# Patient Record
Sex: Female | Born: 1965 | Race: White | Hispanic: No | Marital: Married | Smoking: Current every day smoker
Health system: Southern US, Community
[De-identification: ages and names within clinical notes are randomized; demographics above are authoritative.]

## PROBLEM LIST (undated history)

## (undated) DIAGNOSIS — R17 Unspecified jaundice: Secondary | ICD-10-CM

## (undated) DIAGNOSIS — E559 Vitamin D deficiency, unspecified: Secondary | ICD-10-CM

## (undated) DIAGNOSIS — M797 Fibromyalgia: Secondary | ICD-10-CM

## (undated) DIAGNOSIS — F32A Depression, unspecified: Secondary | ICD-10-CM

## (undated) DIAGNOSIS — H269 Unspecified cataract: Secondary | ICD-10-CM

## (undated) DIAGNOSIS — E785 Hyperlipidemia, unspecified: Secondary | ICD-10-CM

## (undated) DIAGNOSIS — F329 Major depressive disorder, single episode, unspecified: Secondary | ICD-10-CM

## (undated) DIAGNOSIS — K760 Fatty (change of) liver, not elsewhere classified: Secondary | ICD-10-CM

## (undated) HISTORY — DX: Major depressive disorder, single episode, unspecified: F32.9

## (undated) HISTORY — DX: Hyperlipidemia, unspecified: E78.5

## (undated) HISTORY — DX: Unspecified jaundice: R17

## (undated) HISTORY — PX: CHOLECYSTECTOMY: SHX55

## (undated) HISTORY — DX: Fibromyalgia: M79.7

## (undated) HISTORY — DX: Depression, unspecified: F32.A

## (undated) HISTORY — PX: ABDOMINAL HYSTERECTOMY: SHX81

## (undated) HISTORY — DX: Fatty (change of) liver, not elsewhere classified: K76.0

## (undated) HISTORY — DX: Vitamin D deficiency, unspecified: E55.9

## (undated) HISTORY — DX: Unspecified cataract: H26.9

## (undated) HISTORY — PX: PARTIAL HYSTERECTOMY: SHX80

---

## 2005-04-12 DIAGNOSIS — M797 Fibromyalgia: Secondary | ICD-10-CM

## 2005-04-12 HISTORY — DX: Fibromyalgia: M79.7

## 2005-06-16 ENCOUNTER — Emergency Department: Payer: Self-pay | Admitting: Emergency Medicine

## 2005-12-29 ENCOUNTER — Ambulatory Visit: Payer: Self-pay | Admitting: Internal Medicine

## 2006-03-02 ENCOUNTER — Ambulatory Visit: Payer: Self-pay | Admitting: Gastroenterology

## 2006-03-08 ENCOUNTER — Ambulatory Visit: Payer: Self-pay | Admitting: Gastroenterology

## 2006-04-14 ENCOUNTER — Ambulatory Visit: Payer: Self-pay | Admitting: Gastroenterology

## 2006-04-22 ENCOUNTER — Ambulatory Visit: Payer: Self-pay | Admitting: Gastroenterology

## 2006-10-31 ENCOUNTER — Emergency Department: Payer: Self-pay | Admitting: Emergency Medicine

## 2007-02-24 ENCOUNTER — Other Ambulatory Visit: Payer: Self-pay

## 2007-02-24 ENCOUNTER — Emergency Department: Payer: Self-pay | Admitting: Emergency Medicine

## 2007-06-23 ENCOUNTER — Inpatient Hospital Stay: Payer: Self-pay | Admitting: Surgery

## 2007-06-23 ENCOUNTER — Other Ambulatory Visit: Payer: Self-pay

## 2007-10-04 ENCOUNTER — Ambulatory Visit: Payer: Self-pay | Admitting: Internal Medicine

## 2009-05-04 LAB — HM PAP SMEAR: HM PAP: NORMAL

## 2009-05-04 LAB — HM MAMMOGRAPHY: HM Mammogram: NORMAL

## 2009-09-12 ENCOUNTER — Ambulatory Visit: Payer: Self-pay | Admitting: Unknown Physician Specialty

## 2009-09-17 ENCOUNTER — Ambulatory Visit: Payer: Self-pay | Admitting: Unknown Physician Specialty

## 2009-11-26 ENCOUNTER — Ambulatory Visit: Payer: Self-pay | Admitting: Unknown Physician Specialty

## 2010-02-18 ENCOUNTER — Ambulatory Visit: Payer: Self-pay | Admitting: Internal Medicine

## 2010-03-26 ENCOUNTER — Ambulatory Visit: Payer: Self-pay | Admitting: Internal Medicine

## 2010-04-05 ENCOUNTER — Emergency Department: Payer: Self-pay | Admitting: Internal Medicine

## 2010-04-09 ENCOUNTER — Ambulatory Visit: Payer: Self-pay | Admitting: Podiatry

## 2011-10-08 DIAGNOSIS — M545 Low back pain, unspecified: Secondary | ICD-10-CM | POA: Insufficient documentation

## 2011-10-08 DIAGNOSIS — F332 Major depressive disorder, recurrent severe without psychotic features: Secondary | ICD-10-CM | POA: Insufficient documentation

## 2011-10-08 DIAGNOSIS — M797 Fibromyalgia: Secondary | ICD-10-CM | POA: Insufficient documentation

## 2011-10-08 DIAGNOSIS — G43909 Migraine, unspecified, not intractable, without status migrainosus: Secondary | ICD-10-CM | POA: Insufficient documentation

## 2012-01-10 DIAGNOSIS — F17200 Nicotine dependence, unspecified, uncomplicated: Secondary | ICD-10-CM | POA: Insufficient documentation

## 2012-01-10 DIAGNOSIS — F1190 Opioid use, unspecified, uncomplicated: Secondary | ICD-10-CM | POA: Insufficient documentation

## 2012-01-10 DIAGNOSIS — K219 Gastro-esophageal reflux disease without esophagitis: Secondary | ICD-10-CM | POA: Insufficient documentation

## 2012-05-05 DIAGNOSIS — M7918 Myalgia, other site: Secondary | ICD-10-CM | POA: Insufficient documentation

## 2012-07-05 ENCOUNTER — Ambulatory Visit (INDEPENDENT_AMBULATORY_CARE_PROVIDER_SITE_OTHER): Payer: BC Managed Care – PPO | Admitting: Adult Health

## 2012-07-05 ENCOUNTER — Encounter: Payer: Self-pay | Admitting: Adult Health

## 2012-07-05 VITALS — BP 100/66 | HR 79 | Temp 98.3°F | Ht 67.0 in | Wt 203.0 lb

## 2012-07-05 DIAGNOSIS — N390 Urinary tract infection, site not specified: Secondary | ICD-10-CM

## 2012-07-05 DIAGNOSIS — R300 Dysuria: Secondary | ICD-10-CM | POA: Insufficient documentation

## 2012-07-05 LAB — POCT URINALYSIS DIPSTICK
Glucose, UA: NEGATIVE
Leukocytes, UA: NEGATIVE
Spec Grav, UA: >=1.03

## 2012-07-05 MED ORDER — CIPROFLOXACIN HCL 250 MG PO TABS: 250.0000 mg | ORAL_TABLET | Freq: Two times a day (BID) | ORAL | Status: DC

## 2012-07-05 NOTE — Assessment & Plan Note (Signed)
Suspect patient may have a urinary tract infection although urine dipstick is negative for leukocytes and nitrite. Send urine for culture. Start Cipro x3 days. Return to clinic if symptoms do not resolve.

## 2012-07-05 NOTE — Progress Notes (Signed)
  Subjective:    Patient ID: Brenda Conrad, female    DOB: 1965-10-26, 47 y.o.   MRN: [id number]  HPI  Patient is a 47 year old female followed by Dr. Dan Humphreys at [facility] who presents with dysuria and pressure over bladder area. Patient reports symptoms started ~ 2 weeks ago. She denies fever or chills. She took OTC pyridium and felt relief for a few days but then symptoms returned.    Past Medical History  Diagnosis Date  . Fibromyalgia 2007  . Depression   . HLD (hyperlipidemia)     History   Social History  . Marital Status: Married    Spouse Name: N/A    Number of Children: N/A  . Years of Education: N/A   Occupational History  . Not on file.   Social History Main Topics  . Smoking status: Current Every Day Smoker    Types: Cigarettes  . Smokeless tobacco: Not on file  . Alcohol Use: Yes  . Drug Use: No  . Sexually Active: Not on file   Other Topics Concern  . Not on file   Social History Narrative  . No narrative on file     Review of Systems  Genitourinary: Positive for dysuria, frequency and difficulty urinating. Negative for hematuria and flank pain.       Bladder discomfort   BP 100/66  Pulse 79  Temp(Src) 98.3 F (36.8 C) (Oral)  Ht 5\' 7"  (1.702 m)  Wt 203 lb (92.08 kg)  BMI 31.79 kg/m2  SpO2 98%     Objective:   Physical Exam  Constitutional: She is oriented to person, place, and time. No distress.  Overweight  Cardiovascular: Normal rate, regular rhythm and normal heart sounds.   Pulmonary/Chest: Effort normal and breath sounds normal.  Abdominal: Soft. Bowel sounds are normal. There is no tenderness. There is no rebound and no guarding.  Negative for suprapubic tenderness upon palpation.  Neurological: She is alert and oriented to person, place, and time.  Skin: Skin is warm and dry.  Psychiatric: She has a normal mood and affect. Her behavior is normal. Judgment and thought content normal.       Assessment & Plan:

## 2012-07-05 NOTE — Patient Instructions (Addendum)
  Please start Cipro today. You will take this twice a day for 3 days.  If your symptoms are not improved within 3-4 days, please call the office.

## 2012-07-07 LAB — URINE CULTURE

## 2013-04-26 LAB — HM COLONOSCOPY

## 2013-05-02 ENCOUNTER — Telehealth: Payer: Self-pay | Admitting: Internal Medicine

## 2013-05-02 NOTE — Telephone Encounter (Signed)
We saw her at the other office, but I have not seen her in 3 years. She will need to set up a visit to restablish care.

## 2013-05-02 NOTE — Telephone Encounter (Signed)
Is this your patient?

## 2013-05-02 NOTE — Telephone Encounter (Signed)
The patient is wanting a referral to Dr. Bing Reeavid Marks ,[facility] .She is needing this appointment before 2.2.15. She stated she is a patient of Dr. Tilman NeatWalker's ,but I don't see where she was seen by Dr. Dan HumphreysWalker.

## 2013-05-03 NOTE — Telephone Encounter (Signed)
Called to speak with patient, per patient she has already been scheduled an appointment with Dr. Dan HumphreysWalker for tomorrow.

## 2013-05-04 ENCOUNTER — Encounter (INDEPENDENT_AMBULATORY_CARE_PROVIDER_SITE_OTHER): Payer: Self-pay

## 2013-05-04 ENCOUNTER — Ambulatory Visit (INDEPENDENT_AMBULATORY_CARE_PROVIDER_SITE_OTHER): Payer: Medicare HMO | Admitting: Internal Medicine

## 2013-05-04 ENCOUNTER — Encounter: Payer: Self-pay | Admitting: Internal Medicine

## 2013-05-04 VITALS — BP 102/70 | HR 62 | Temp 98.0°F | Wt 155.0 lb

## 2013-05-04 DIAGNOSIS — F1110 Opioid abuse, uncomplicated: Secondary | ICD-10-CM

## 2013-05-04 DIAGNOSIS — R634 Abnormal weight loss: Secondary | ICD-10-CM

## 2013-05-04 DIAGNOSIS — F17200 Nicotine dependence, unspecified, uncomplicated: Secondary | ICD-10-CM

## 2013-05-04 DIAGNOSIS — F1190 Opioid use, unspecified, uncomplicated: Secondary | ICD-10-CM

## 2013-05-04 DIAGNOSIS — IMO0001 Reserved for inherently not codable concepts without codable children: Secondary | ICD-10-CM

## 2013-05-04 DIAGNOSIS — F329 Major depressive disorder, single episode, unspecified: Secondary | ICD-10-CM

## 2013-05-04 DIAGNOSIS — M797 Fibromyalgia: Secondary | ICD-10-CM

## 2013-05-04 DIAGNOSIS — Z1239 Encounter for other screening for malignant neoplasm of breast: Secondary | ICD-10-CM

## 2013-05-04 DIAGNOSIS — Z1211 Encounter for screening for malignant neoplasm of colon: Secondary | ICD-10-CM

## 2013-05-04 DIAGNOSIS — R17 Unspecified jaundice: Secondary | ICD-10-CM

## 2013-05-04 LAB — COMPREHENSIVE METABOLIC PANEL
ALK PHOS: 107 U/L (ref 39–117)
ALT: 20 U/L (ref 0–35)
AST: 22 U/L (ref 0–37)
Albumin: 3.9 g/dL (ref 3.5–5.2)
BILIRUBIN TOTAL: 1.5 mg/dL — AB (ref 0.3–1.2)
BUN: 10 mg/dL (ref 6–23)
CO2: 31 mEq/L (ref 19–32)
Calcium: 8.9 mg/dL (ref 8.4–10.5)
Chloride: 103 mEq/L (ref 96–112)
Creatinine, Ser: 0.8 mg/dL (ref 0.4–1.2)
GFR: 76.3 mL/min (ref >60.00)
GLUCOSE: 89 mg/dL (ref 70–99)
Potassium: 3.8 mEq/L (ref 3.5–5.1)
SODIUM: 137 meq/L (ref 135–145)
Total Protein: 6.4 g/dL (ref 6.0–8.3)

## 2013-05-04 LAB — CBC WITH DIFFERENTIAL/PLATELET
BASOS PCT: 0.7 % (ref 0.0–3.0)
Basophils Absolute: 0 10*3/uL (ref 0.0–0.1)
Eosinophils Absolute: 0 10*3/uL (ref 0.0–0.7)
Eosinophils Relative: 0.8 % (ref 0.0–5.0)
HCT: 44.1 % (ref 36.0–46.0)
HEMOGLOBIN: 15 g/dL (ref 12.0–15.0)
LYMPHS PCT: 37.5 % (ref 12.0–46.0)
Lymphs Abs: 2.1 10*3/uL (ref 0.7–4.0)
MCHC: 34.1 g/dL (ref 30.0–36.0)
MCV: 92.1 fl (ref 78.0–100.0)
MONOS PCT: 5.6 % (ref 3.0–12.0)
Monocytes Absolute: 0.3 10*3/uL (ref 0.1–1.0)
NEUTROS ABS: 3.1 10*3/uL (ref 1.4–7.7)
Neutrophils Relative %: 55.4 % (ref 43.0–77.0)
Platelets: 132 10*3/uL — ABNORMAL LOW (ref 150.0–400.0)
RBC: 4.79 Mil/uL (ref 3.87–5.11)
RDW: 12.8 % (ref 11.5–14.6)
WBC: 5.6 10*3/uL (ref 4.5–10.5)

## 2013-05-04 LAB — LIPID PANEL
Cholesterol: 170 mg/dL (ref 0–200)
HDL: 53.8 mg/dL (ref >39.00)
LDL CALC: 101 mg/dL — AB (ref 0–99)
TRIGLYCERIDES: 75 mg/dL (ref 0.0–149.0)
Total CHOL/HDL Ratio: 3
VLDL: 15 mg/dL (ref 0.0–40.0)

## 2013-05-04 LAB — TSH: TSH: 1.12 u[IU]/mL (ref 0.35–5.50)

## 2013-05-04 LAB — HEMOGLOBIN A1C: Hgb A1c MFr Bld: 5.2 % (ref 4.6–6.5)

## 2013-05-04 NOTE — Progress Notes (Signed)
Pre-visit discussion using our clinic review tool. No additional management support is needed unless otherwise documented below in the visit note.  

## 2013-05-05 DIAGNOSIS — F329 Major depressive disorder, single episode, unspecified: Secondary | ICD-10-CM | POA: Insufficient documentation

## 2013-05-05 DIAGNOSIS — Z1239 Encounter for other screening for malignant neoplasm of breast: Secondary | ICD-10-CM | POA: Insufficient documentation

## 2013-05-05 DIAGNOSIS — R634 Abnormal weight loss: Secondary | ICD-10-CM | POA: Insufficient documentation

## 2013-05-05 DIAGNOSIS — Z1211 Encounter for screening for malignant neoplasm of colon: Secondary | ICD-10-CM | POA: Insufficient documentation

## 2013-05-05 DIAGNOSIS — M797 Fibromyalgia: Secondary | ICD-10-CM | POA: Insufficient documentation

## 2013-05-05 DIAGNOSIS — R17 Unspecified jaundice: Secondary | ICD-10-CM | POA: Insufficient documentation

## 2013-05-05 NOTE — Assessment & Plan Note (Signed)
Elevated bilirubin noted on labs. Will set up abdominal US for evaluation of liver and pancreas.

## 2013-05-05 NOTE — Assessment & Plan Note (Signed)
Wt Readings from Last 3 Encounters:  05/04/13 155 lb (70.308 kg)  07/05/12 203 lb (92.08 kg)   50lb unintentional weight loss over last year. Labs remarkable only for elevated bilirubin. Recommended abdominal US to evaluate pancreas, liver. Rapid weight loss concerning for malignancy, however pt reports [facility]. Will request records. Encouraged her to consider CT chest to screen for lung cancer, as she is chronic smoker.

## 2013-05-05 NOTE — Assessment & Plan Note (Signed)
Referral placed for screening colonoscopy.

## 2013-05-05 NOTE — Assessment & Plan Note (Signed)
Chronic pain, on disability. [facility]

## 2013-05-05 NOTE — Assessment & Plan Note (Signed)
Symptoms have been controlled with citalopram. Anti-depressant medication [facility], will continue.

## 2013-05-05 NOTE — Assessment & Plan Note (Signed)
Chronic narcotic use for pain management. All narcotics through [facility].

## 2013-05-05 NOTE — Assessment & Plan Note (Signed)
Encouraged smoking cessation. Encouraged CT to screen for lung cancer. Pt will think about this.

## 2013-05-05 NOTE — Progress Notes (Signed)
Subjective:    Patient ID: Brenda Conrad, female    DOB: [birth year], 48 y.o.   MRN: [id number]  HPI 48 year old female with history of fibromyalgia, chronic pain, depression presents for followup. She has been lost to followup for greater than 2 years with the exception of visit for a UTI. She reports that she has been following a regular basis with her pain management physician at [facility]. He continues her on MS Contin and oxycodone. We reviewed his last notes through Epic today. He reports that he is trying to reduce her dose of oxycodone. She reports that pain control has been good on these medications.  Over the last couple of years she has lost 150 pounds. She reports that she has had very poor appetite and was noted to have dysfunction of her gallbladder 2 years ago and had cholecystectomy. However, her appetite has not really improved. She denies any current abdominal pain, nausea, vomiting. She does have intermittent diarrhea and constipation. She has had repeat upper endoscopy which she reports was normal. She reports over the last few months her weight has seemed to stabilize. She is not trying to lose weight. No other focal symptoms such as chest pain, dyspnea, cough.  In regards to depression, symptoms have been well-controlled on citalopram. She has been anxious recently because her son has been diagnosed with "spot on the brain "and she is anxious to get him seen at [facility].  Outpatient Encounter Prescriptions as of 05/04/2013  Medication Sig  . citalopram (CELEXA) 20 MG tablet Take 20 mg by mouth 2 (two) times daily.   . clonazePAM (KLONOPIN) 1 MG tablet   . morphine (MS CONTIN) 30 MG 12 hr tablet Take 30 mg by mouth 2 (two) times daily. 1 tab bid  . Oxycodone HCl 10 MG TABS Take 10 mg by mouth 3 (three) times daily. i 1 tab tifd   BP 102/70  Pulse 62  Temp(Src) 98 F (36.7 C) (Oral)  Wt 155 lb (70.308 kg)  SpO2 97%  Review of Systems  Constitutional: Positive for appetite change  and unexpected weight change. Negative for fever, chills and fatigue.  HENT: Negative for congestion, ear pain, sinus pressure, sore throat, trouble swallowing and voice change.   Eyes: Negative for visual disturbance.  Respiratory: Negative for cough, shortness of breath, wheezing and stridor.   Cardiovascular: Negative for chest pain, palpitations and leg swelling.  Gastrointestinal: Negative for nausea, vomiting, abdominal pain, diarrhea, constipation, blood in stool, abdominal distention and anal bleeding.  Genitourinary: Negative for dysuria and flank pain.  Musculoskeletal: Positive for arthralgias, back pain, myalgias and neck pain. Negative for gait problem.  Skin: Negative for color change and rash.  Neurological: Negative for dizziness and headaches.  Hematological: Negative for adenopathy. Does not bruise/bleed easily.  Psychiatric/Behavioral: Negative for suicidal ideas, sleep disturbance and dysphoric mood. The patient is not nervous/anxious.        Objective:   Physical Exam  Constitutional: She is oriented to person, place, and time. She appears well-developed and well-nourished. No distress.  HENT:  Head: Normocephalic and atraumatic.  Right Ear: External ear normal.  Left Ear: External ear normal.  Nose: Nose normal.  Mouth/Throat: Oropharynx is clear and moist. No oropharyngeal exudate.  Eyes: Conjunctivae are normal. Pupils are equal, round, and reactive to light. Right eye exhibits no discharge. Left eye exhibits no discharge. No scleral icterus.  Neck: Normal range of motion. Neck supple. No tracheal deviation present. No thyromegaly present.  Cardiovascular: Normal rate,  regular rhythm, normal heart sounds and intact distal pulses.  Exam reveals no gallop and no friction rub.   No murmur heard. Pulmonary/Chest: Effort normal and breath sounds normal. No accessory muscle usage. Not tachypneic. No respiratory distress. She has no decreased breath sounds. She has no  wheezes. She has no rhonchi. She has no rales. She exhibits no tenderness.  Abdominal: Soft. Bowel sounds are normal. She exhibits no distension and no mass. There is no tenderness. There is no rebound and no guarding.  Musculoskeletal: Normal range of motion. She exhibits no edema and no tenderness.  Lymphadenopathy:    She has no cervical adenopathy.  Neurological: She is alert and oriented to person, place, and time. No cranial nerve deficit. She exhibits normal muscle tone. Coordination normal.  Skin: Skin is warm and dry. No rash noted. She is not diaphoretic. No erythema. No pallor.  Psychiatric: She has a normal mood and affect. Her behavior is normal. Judgment and thought content normal.          Assessment & Plan:  Over of which >50% spent in face-to-face contact with patient discussing plan of care

## 2013-05-05 NOTE — Assessment & Plan Note (Signed)
Mammogram ordered

## 2013-05-07 ENCOUNTER — Telehealth: Payer: Self-pay | Admitting: Internal Medicine

## 2013-05-07 NOTE — Telephone Encounter (Signed)
Relevant patient education assigned to patient using Emmi. ° °

## 2013-05-09 ENCOUNTER — Encounter: Payer: Self-pay | Admitting: Emergency Medicine

## 2013-05-11 ENCOUNTER — Ambulatory Visit: Payer: Self-pay | Admitting: Internal Medicine

## 2013-05-14 ENCOUNTER — Telehealth: Payer: Self-pay | Admitting: Internal Medicine

## 2013-05-14 NOTE — Telephone Encounter (Signed)
The patient is calling for her Ultrasound results from Friday 1.30.15. I did inform her that Dr. Dan HumphreysWalker is out of the office and she would return on Thursday. She stated she was needing to know her results before then.

## 2013-05-16 NOTE — Telephone Encounter (Signed)
Results are in Dr. Tilman NeatWalker's folder for review upon her return.

## 2013-05-16 NOTE — Telephone Encounter (Signed)
Patient informed and verbalized understanding

## 2013-05-16 NOTE — Telephone Encounter (Signed)
Ultrasound of the abdomen was normal.

## 2013-06-11 ENCOUNTER — Encounter: Payer: Self-pay | Admitting: Emergency Medicine

## 2013-06-18 ENCOUNTER — Telehealth: Payer: Self-pay | Admitting: Internal Medicine

## 2013-06-18 NOTE — Telephone Encounter (Signed)
The patient is wanting to come in to leave a specimen to check for a UTI . Please advise . No available appointments.

## 2013-06-18 NOTE — Telephone Encounter (Signed)
Spoke with patient, she is having some painful urination and back pain. Explained to patient she would have to be seen prior to any antibiotics being called in. Patient confirmed appointment for tomorrow with NP, Raquel.

## 2013-06-19 ENCOUNTER — Ambulatory Visit: Payer: Medicare HMO | Admitting: Adult Health

## 2013-07-10 ENCOUNTER — Encounter: Payer: Self-pay | Admitting: Internal Medicine

## 2013-08-03 ENCOUNTER — Encounter: Payer: Medicare HMO | Admitting: Internal Medicine

## 2013-08-30 ENCOUNTER — Ambulatory Visit (INDEPENDENT_AMBULATORY_CARE_PROVIDER_SITE_OTHER): Payer: Commercial Managed Care - HMO | Admitting: Internal Medicine

## 2013-08-30 ENCOUNTER — Encounter: Payer: Self-pay | Admitting: Internal Medicine

## 2013-08-30 VITALS — BP 90/68 | HR 80 | Ht 66.0 in | Wt 179.4 lb

## 2013-08-30 DIAGNOSIS — R1084 Generalized abdominal pain: Secondary | ICD-10-CM

## 2013-08-30 DIAGNOSIS — K5900 Constipation, unspecified: Secondary | ICD-10-CM

## 2013-08-30 DIAGNOSIS — Z1211 Encounter for screening for malignant neoplasm of colon: Secondary | ICD-10-CM

## 2013-08-30 MED ORDER — LINACLOTIDE 145 MCG PO CAPS: 145.0000 ug | ORAL_CAPSULE | Freq: Every day | ORAL | Status: DC

## 2013-08-30 MED ORDER — MOVIPREP 100 G PO SOLR: 1.0000 | Freq: Once | ORAL | Status: DC

## 2013-08-30 NOTE — Patient Instructions (Addendum)
You have been given a separate informational sheet regarding your tobacco use, the importance of quitting and local resources to help you quit.  You have been given some samples of Linzess.  Take one capsule 30 minutes before breakfast.  You have been scheduled for a colonoscopy with propofol. Please follow written instructions given to you at your visit today.  Please pick up your prep kit at the pharmacy within the next 1-3 days. If you use inhalers (even only as needed), please bring them with you on the day of your procedure. Your physician has requested that you go to www.startemmi.com and enter the access code given to you at your visit today. This web site gives a general overview about your procedure. However, you should still follow specific instructions given to you by our office regarding your preparation for the procedure.

## 2013-08-30 NOTE — Progress Notes (Signed)
HISTORY OF PRESENT ILLNESS:  Brenda ClinesLesia Conrad is a 48 y.o. female on disability and chronic narcotics for fibromyalgia who is sent today regarding constipation and screening colonoscopy. The patient reports chronic constipation for several years. Worse recently. Has taken MiraLax in the past without improvement. Dulcolax causes abdominal cramping. More recently placed on lactulose with mixed results. She also reports a she's with weight loss related to loss of appetite. Reports 100 pound weight loss. However, her weight in March of 2014 was 203. In January of 2015 was 155. Currently 179 pounds,  as she acknowledges 20-25 pound weight gain since January with improved appetite. Does have some abdominal discomfort related to constipation. Abdominal ultrasound in January was unremarkable postcholecystectomy. Extensive workup at [facility] for elevated liver tests including liver biopsy. Diagnosed to fatty liver. Most recent liver tests essentially normal with bilirubin 1.5 as the only slight abnormality. She has not had screening colonoscopy. Now age 48. No family history of colon cancer. Review of laboratories from January unremarkable except for platelets of 132,000. [facility]. In addition to prior cholecystectomy, she has had a number of EGDs elsewhere which she reports as being normal  REVIEW OF SYSTEMS:  All non-GI ROS negative except for anxiety, back pain, confusion, depression, fatigue, headaches, muscle pains, muscle cramps, insomnia  Past Medical History  Diagnosis Date  . Fibromyalgia 2007  . Depression   . HLD (hyperlipidemia)   . Elevated bilirubin   . Fatty liver   . Vitamin D deficiency   . Cataract     Past Surgical History  Procedure Laterality Date  . Cholecystectomy    . Vaginal delivery      1  . Partial hysterectomy      Social History Brenda ClinesLesia Conrad  reports that she has been smoking Cigarettes.  She has been smoking about 0.00 packs per day. She has  never used smokeless tobacco. She reports that she drinks alcohol. She reports that she does not use illicit drugs.  family history includes Breast cancer in her sister; Heart disease in her brother, father, and mother.  Allergies  Allergen Reactions  . Vicodin [Hydrocodone-Acetaminophen]        PHYSICAL EXAMINATION: Vital signs: BP 90/68  Pulse 80  Ht 5\' 6"  (1.676 m)  Wt 179 lb 6 oz (81.364 kg)  BMI 28.97 kg/m2  Constitutional: generally well-appearing, no acute distress Psychiatric: alert and oriented x3, cooperative Eyes: extraocular movements intact, anicteric, conjunctiva pink Mouth: oral pharynx moist, no lesions Neck: supple no lymphadenopathy Cardiovascular: heart regular rate and rhythm, no murmur Lungs: clear to auscultation bilaterally Abdomen: soft, obese, nontender, nondistended, no obvious ascites, no peritoneal signs, normal bowel sounds, no organomegaly Rectal: Deferred until colonoscopy Extremities: no lower extremity edema bilaterally Skin: no lesions on visible extremities Neuro: No focal deficits. No asterixis.    ASSESSMENT:  #1. Chronic constipation with associated abdominal discomfort. Functional and exacerbated by medications including narcotics #2. Screening colonoscopy. Baseline risk. Appropriate candidate without contraindication #3. History of transient weight loss. Weight gain recently. Still above ideal body weight #4. Chronic pain syndrome. On chronic narcotics #5. History of elevated LFTs (normal female) [facility] including liver biopsy with minimal findings aside from fatty liver   PLAN:  #1. Continue lactulose #2. Linzess 145 mcg daily for one week. If helpful continue. If not helpful increase dose to 290 mcg daily. Multiple samples given #3. Colonoscopy.The nature of the procedure, as well as the risks, benefits, and alternatives  were carefully and thoroughly reviewed with the patient. Ample time for discussion and questions  allowed. The patient understood, was satisfied, and agreed to proceed. Given chronic constipation will add one bottle of magnesium site trade the morning prior to the exam and prior to standard Movi prep.

## 2013-09-10 ENCOUNTER — Telehealth: Payer: Self-pay | Admitting: Internal Medicine

## 2013-09-11 NOTE — Telephone Encounter (Signed)
Called [facility] and was told that I could fax a copy of the free Moviprep voucher as patient could not come to the office to pick it up.  Faxed the voucher and called patient to let her know.  Patient acknowledged and understood

## 2013-09-13 ENCOUNTER — Ambulatory Visit (AMBULATORY_SURGERY_CENTER): Payer: Medicare HMO | Admitting: Internal Medicine

## 2013-09-13 ENCOUNTER — Encounter: Payer: Self-pay | Admitting: Internal Medicine

## 2013-09-13 VITALS — BP 102/62 | HR 66 | Temp 97.7°F | Resp 10 | Ht 66.0 in | Wt 179.0 lb

## 2013-09-13 DIAGNOSIS — Z1211 Encounter for screening for malignant neoplasm of colon: Secondary | ICD-10-CM

## 2013-09-13 MED ORDER — SODIUM CHLORIDE 0.9 % IV SOLN: 500.0000 mL | INTRAVENOUS | Status: DC

## 2013-09-13 NOTE — Progress Notes (Signed)
Procedure ends, to recovery, report given and VSS. 

## 2013-09-13 NOTE — Patient Instructions (Signed)
Discharge instructions given with verbal understanding. Handouts on diverticulosis and a high fiber diet. Resume previous medications. YOU HAD AN ENDOSCOPIC PROCEDURE TODAY AT THE [facility]: Refer to the procedure report that was given to you for any specific questions about what was found during the examination.  If the procedure report does not answer your questions, please call your gastroenterologist to clarify.  If you requested that your care partner not be given the details of your procedure findings, then the procedure report has been included in a sealed envelope for you to review at your convenience later.  YOU SHOULD EXPECT: Some feelings of bloating in the abdomen. Passage of more gas than usual.  Walking can help get rid of the air that was put into your GI tract during the procedure and reduce the bloating. If you had a lower endoscopy (such as a colonoscopy or flexible sigmoidoscopy) you may notice spotting of blood in your stool or on the toilet paper. If you underwent a bowel prep for your procedure, then you may not have a normal bowel movement for a few days.  DIET: Your first meal following the procedure should be a light meal and then it is ok to progress to your normal diet.  A half-sandwich or bowl of soup is an example of a good first meal.  Heavy or fried foods are harder to digest and may make you feel nauseous or bloated.  Likewise meals heavy in dairy and vegetables can cause extra gas to form and this can also increase the bloating.  Drink plenty of fluids but you should avoid alcoholic beverages for 24 hours.  ACTIVITY: Your care partner should take you home directly after the procedure.  You should plan to take it easy, moving slowly for the rest of the day.  You can resume normal activity the day after the procedure however you should NOT DRIVE or use heavy machinery for 24 hours (because of the sedation medicines used during the test).    SYMPTOMS TO REPORT  IMMEDIATELY: A gastroenterologist can be reached at any hour.  During normal business hours, 8:30 AM to 5:00 PM Monday through Friday, call [phone].  After hours and on weekends, please call the GI answering service at [phone] who will take a message and have the physician on call contact you.   Following lower endoscopy (colonoscopy or flexible sigmoidoscopy):  Excessive amounts of blood in the stool  Significant tenderness or worsening of abdominal pains  Swelling of the abdomen that is new, acute  Fever of 100F or higher FOLLOW UP: If any biopsies were taken you will be contacted by phone or by letter within the next 1-3 weeks.  Call your gastroenterologist if you have not heard about the biopsies in 3 weeks.  Our staff will call the home number listed on your records the next business day following your procedure to check on you and address any questions or concerns that you may have at that time regarding the information given to you following your procedure. This is a courtesy call and so if there is no answer at the home number and we have not heard from you through the emergency physician on call, we will assume that you have returned to your regular daily activities without incident.  SIGNATURES/CONFIDENTIALITY: You and/or your care partner have signed paperwork which will be entered into your electronic medical record.  These signatures attest to the fact that that the information above on your After   Visit Summary has been reviewed and is understood.  Full responsibility of the confidentiality of this discharge information lies with you and/or your care-partner. 

## 2013-09-13 NOTE — Op Note (Signed)
[facility] [address] [place], [address]   COLONOSCOPY PROCEDURE REPORT  PATIENT: Brenda Conrad, Brenda Conrad  MR#: [id number] BIRTHDATE: September 28, 1965 , 48  yrs. old GENDER: Female ENDOSCOPIST: Roxy Cedar, MD REFERRED DZ:[id number] Dan Humphreys, M.D. PROCEDURE DATE:  09/13/2013 PROCEDURE:   Colonoscopy, screening First Screening Colonoscopy - Avg.  risk and is 50 yrs.  old or older Yes.  Prior Negative Screening - Now for repeat screening. N/A  History of Adenoma - Now for follow-up colonoscopy & has been > or = to 3 yrs.  N/A  Polyps Removed Today? No.  Recommend repeat exam, <10 yrs? No. ASA CLASS:   Class II INDICATIONS:average risk screening. MEDICATIONS: MAC sedation, administered by CRNA and propofol (Diprivan) 240mg  IV  DESCRIPTION OF PROCEDURE:   After the risks benefits and alternatives of the procedure were thoroughly explained, informed consent was obtained.  A digital rectal exam revealed no abnormalities of the rectum.   The LB [id number]  endoscope was introduced through the anus and advanced to the cecum, which was identified by both the appendix and ileocecal valve. No adverse events experienced.   The quality of the prep was excellent, using MoviPrep  The instrument was then slowly withdrawn as the colon was fully examined.   COLON FINDINGS: Mild diverticulosis was noted in the sigmoid colon. The colon was otherwise normal.  There was no  inflammation, polyps or cancers unless previously stated.  Retroflexed views revealed internal hemorrhoids. The time to cecum=3 minutes 0 seconds.  Withdrawal time=10 minutes 44 seconds.  The scope was withdrawn and the procedure completed.  COMPLICATIONS: There were no complications.  ENDOSCOPIC IMPRESSION: 1.   Mild diverticulosis was noted in the sigmoid colon 2.   The colon was otherwise normal  RECOMMENDATIONS: 1. Continue current colorectal screening recommendations for "routine risk" patients with a  repeat colonoscopy in 10 years.   eSigned:  Roxy Cedar, MD 09/13/2013 11:23 AM   cc: Ronna Polio MD and The Patient

## 2013-09-14 ENCOUNTER — Telehealth: Payer: Self-pay

## 2013-09-14 ENCOUNTER — Encounter: Payer: Self-pay | Admitting: Internal Medicine

## 2013-09-14 NOTE — Telephone Encounter (Signed)
Left message on answering machine. 

## 2013-09-27 ENCOUNTER — Encounter: Payer: Medicare HMO | Admitting: Internal Medicine

## 2013-10-02 ENCOUNTER — Telehealth: Payer: Self-pay | Admitting: Internal Medicine

## 2013-10-02 NOTE — Telephone Encounter (Signed)
Error

## 2013-10-03 ENCOUNTER — Telehealth: Payer: Self-pay

## 2013-10-03 MED ORDER — LINACLOTIDE 290 MCG PO CAPS: 290.0000 ug | ORAL_CAPSULE | Freq: Every day | ORAL | Status: DC

## 2013-10-03 NOTE — Telephone Encounter (Signed)
Sent rx for Linzess 290 mcg to pharmacy and let patient know I would leave samples and a discount card up front for her to pick up.  Patient agreed

## 2013-10-05 ENCOUNTER — Telehealth: Payer: Self-pay

## 2013-10-05 ENCOUNTER — Telehealth: Payer: Self-pay | Admitting: Internal Medicine

## 2013-10-05 NOTE — Telephone Encounter (Signed)
Spoke with pharmacist who told me Linzess coupon would not work because patient has Medicare and that in light of this, patient had declined the medication.

## 2013-10-05 NOTE — Telephone Encounter (Signed)
Patient has not been able to pick up the discount card that would make her first month of Linzess free.  I called the pharmacy and gave them the information over the phone.  They are running the numbers and told me to call back later to see if this was approved.

## 2013-10-05 NOTE — Telephone Encounter (Signed)
See phone note in mrn #[id number]

## 2013-10-24 ENCOUNTER — Ambulatory Visit (INDEPENDENT_AMBULATORY_CARE_PROVIDER_SITE_OTHER): Payer: Medicare HMO | Admitting: Internal Medicine

## 2013-10-24 ENCOUNTER — Encounter: Payer: Self-pay | Admitting: Internal Medicine

## 2013-10-24 VITALS — BP 100/68 | HR 87 | Temp 98.7°F | Ht 66.0 in | Wt 191.8 lb

## 2013-10-24 DIAGNOSIS — K5900 Constipation, unspecified: Secondary | ICD-10-CM

## 2013-10-24 DIAGNOSIS — F17200 Nicotine dependence, unspecified, uncomplicated: Secondary | ICD-10-CM

## 2013-10-24 DIAGNOSIS — Z0000 Encounter for general adult medical examination without abnormal findings: Secondary | ICD-10-CM

## 2013-10-24 DIAGNOSIS — IMO0001 Reserved for inherently not codable concepts without codable children: Secondary | ICD-10-CM

## 2013-10-24 DIAGNOSIS — Z1239 Encounter for other screening for malignant neoplasm of breast: Secondary | ICD-10-CM

## 2013-10-24 DIAGNOSIS — M797 Fibromyalgia: Secondary | ICD-10-CM

## 2013-10-24 LAB — COMPREHENSIVE METABOLIC PANEL
ALK PHOS: 191 U/L — AB (ref 39–117)
ALT: 46 U/L — ABNORMAL HIGH (ref 0–35)
AST: 28 U/L (ref 0–37)
Albumin: 4 g/dL (ref 3.5–5.2)
BUN: 16 mg/dL (ref 6–23)
CALCIUM: 9.2 mg/dL (ref 8.4–10.5)
CO2: 31 mEq/L (ref 19–32)
Chloride: 99 mEq/L (ref 96–112)
Creatinine, Ser: 0.9 mg/dL (ref 0.4–1.2)
GFR: 73.13 mL/min (ref >60.00)
GLUCOSE: 98 mg/dL (ref 70–99)
Potassium: 4.1 mEq/L (ref 3.5–5.1)
Sodium: 138 mEq/L (ref 135–145)
Total Bilirubin: 0.6 mg/dL (ref 0.2–1.2)
Total Protein: 6.5 g/dL (ref 6.0–8.3)

## 2013-10-24 LAB — CBC WITH DIFFERENTIAL/PLATELET
Basophils Absolute: 0 10*3/uL (ref 0.0–0.1)
Basophils Relative: 0.5 % (ref 0.0–3.0)
EOS PCT: 1.9 % (ref 0.0–5.0)
Eosinophils Absolute: 0.1 10*3/uL (ref 0.0–0.7)
HEMATOCRIT: 39.6 % (ref 36.0–46.0)
HEMOGLOBIN: 13.4 g/dL (ref 12.0–15.0)
LYMPHS ABS: 1.6 10*3/uL (ref 0.7–4.0)
Lymphocytes Relative: 30.4 % (ref 12.0–46.0)
MCHC: 33.9 g/dL (ref 30.0–36.0)
MCV: 93.4 fl (ref 78.0–100.0)
MONO ABS: 0.2 10*3/uL (ref 0.1–1.0)
MONOS PCT: 4.6 % (ref 3.0–12.0)
NEUTROS ABS: 3.4 10*3/uL (ref 1.4–7.7)
Neutrophils Relative %: 62.6 % (ref 43.0–77.0)
PLATELETS: 167 10*3/uL (ref 150.0–400.0)
RBC: 4.24 Mil/uL (ref 3.87–5.11)
RDW: 13 % (ref 11.5–15.5)
WBC: 5.4 10*3/uL (ref 4.0–10.5)

## 2013-10-24 LAB — MICROALBUMIN / CREATININE URINE RATIO
Creatinine,U: 96.8 mg/dL
MICROALB/CREAT RATIO: 0.5 mg/g (ref 0.0–30.0)
Microalb, Ur: 0.5 mg/dL (ref 0.0–1.9)

## 2013-10-24 LAB — LIPID PANEL
CHOLESTEROL: 239 mg/dL — AB (ref 0–200)
HDL: 88.3 mg/dL (ref >39.00)
LDL CALC: 133 mg/dL — AB (ref 0–99)
NonHDL: 150.7
TRIGLYCERIDES: 87 mg/dL (ref 0.0–149.0)
Total CHOL/HDL Ratio: 3
VLDL: 17.4 mg/dL (ref 0.0–40.0)

## 2013-10-24 LAB — HEMOGLOBIN A1C: HEMOGLOBIN A1C: 5.5 % (ref 4.6–6.5)

## 2013-10-24 LAB — HM PAP SMEAR

## 2013-10-24 LAB — TSH: TSH: 1.91 u[IU]/mL (ref 0.35–4.50)

## 2013-10-24 MED ORDER — LUBIPROSTONE 24 MCG PO CAPS: 24.0000 ug | ORAL_CAPSULE | Freq: Every day | ORAL | Status: DC

## 2013-10-24 NOTE — Assessment & Plan Note (Signed)
Symptoms poorly controlled with MS Contin and Oxycodone. Additional evaluation of back pain with MRI lumbar spine is pending. She will continue to follow with [facility].

## 2013-10-24 NOTE — Patient Instructions (Addendum)
Labs today.  We will schedule a mammogram.  Start Amitiza daily for constipation.  Please try to quit smoking. This is the single best thing you could do for your health.  Follow up in 6 months and as needed.

## 2013-10-24 NOTE — Assessment & Plan Note (Signed)
General medical exam including breast exam normal today except as noted. PAP and pelvic deferred as pt s/p hysterectomy. Colonoscopy UTD 2015, reviewed today. Mammogram ordered. Pt declines both flu vaccine and pneumonia vaccine. Encouraged healthy diet and exercise. Will check CBC, CMP, lipids, TSH with labs.

## 2013-10-24 NOTE — Progress Notes (Signed)
Subjective:    Patient ID: Brenda ClinesLesia Hensler, female    DOB: 1965/04/22, 48 y.o.   MRN: [id number]  HPI 48YO female presents for physical exam.  Concerned about recent weight gain. No change in diet. Had previously lost near 150lbs over several years, but has now gained back about 40lb in 6 months. Does not exercise. Does not follow any specific diet.  Continues to have severe diffuse pain. Recently worsened with constant pain in her lower back. Having disrupted sleep because of pain. Tried Trazodone with no improvement. Fibromyalgia "jumping" all over body. [facility] and is on MS Contin and Oxycodone with minimal improvement. Scheduled to have MRI Lumbar Spine for next week.  Having constipation with chronic use of narcotics. Neurologist prescribed Linzess, but pt could not afford.  Continues to smoke. Not currently interested in quitting.  Review of Systems  Constitutional: Positive for fatigue. Negative for fever, chills, appetite change and unexpected weight change.  Eyes: Negative for visual disturbance.  Respiratory: Negative for cough and shortness of breath.   Cardiovascular: Negative for chest pain and leg swelling.  Gastrointestinal: Positive for constipation. Negative for nausea, vomiting, abdominal pain and diarrhea.  Musculoskeletal: Positive for arthralgias, back pain, myalgias and neck pain. Negative for joint swelling.  Skin: Negative for color change and rash.  Neurological: Positive for headaches. Negative for weakness.  Hematological: Negative for adenopathy. Does not bruise/bleed easily.  Psychiatric/Behavioral: Positive for sleep disturbance and dysphoric mood. Negative for suicidal ideas. The patient is not nervous/anxious.        Objective:    BP 100/68  Pulse 87  Temp(Src) 98.7 F (37.1 C) (Oral)  Ht 5\' 6"  (1.676 m)  Wt 191 lb 12 oz (86.977 kg)  BMI 30.96 kg/m2  SpO2 94% Physical Exam  Constitutional: She is oriented to person, place,  and time. She appears well-developed and well-nourished. No distress.  HENT:  Head: Normocephalic and atraumatic.  Right Ear: External ear normal.  Left Ear: External ear normal.  Nose: Nose normal.  Mouth/Throat: Oropharynx is clear and moist. No oropharyngeal exudate.  Eyes: Conjunctivae are normal. Pupils are equal, round, and reactive to light. Right eye exhibits no discharge. Left eye exhibits no discharge. No scleral icterus.  Neck: Normal range of motion. Neck supple. No tracheal deviation present. No thyromegaly present.  Cardiovascular: Normal rate, regular rhythm, normal heart sounds and intact distal pulses.  Exam reveals no gallop and no friction rub.   No murmur heard. Pulmonary/Chest: Effort normal and breath sounds normal. No accessory muscle usage. Not tachypneic. No respiratory distress. She has no decreased breath sounds. She has no wheezes. She has no rhonchi. She has no rales. She exhibits no tenderness. Right breast exhibits no inverted nipple, no mass, no nipple discharge, no skin change and no tenderness. Left breast exhibits no inverted nipple, no mass, no nipple discharge, no skin change and no tenderness. Breasts are symmetrical.  Abdominal: Soft. Bowel sounds are normal. She exhibits no distension and no mass. There is no tenderness. There is no rebound and no guarding.  Musculoskeletal: Normal range of motion. She exhibits no edema.       Right shoulder: She exhibits tenderness.       Left shoulder: She exhibits tenderness.  Diffuse tenderness with light palpation, esp over chest and shoulders.  Lymphadenopathy:    She has no cervical adenopathy.  Neurological: She is alert and oriented to person, place, and time. No cranial nerve deficit. She exhibits normal muscle  tone. Coordination normal.  Skin: Skin is warm and dry. No rash noted. She is not diaphoretic. No erythema. No pallor.  Psychiatric: She has a normal mood and affect. Her behavior is normal. Judgment and  thought content normal.          Assessment & Plan:   Problem List Items Addressed This Visit     Unprioritized   Current smoker     Strongly encouraged smoking cessation. Pt does not feel ready to quit.    Fibromyalgia     Symptoms poorly controlled with MS Contin and Oxycodone. Additional evaluation of back pain with MRI lumbar spine is pending. She will continue to follow with [facility].    Routine general medical examination at a health care facility - Primary     General medical exam including breast exam normal today except as noted. PAP and pelvic deferred as pt s/p hysterectomy. Colonoscopy UTD 2015, reviewed today. Mammogram ordered. Pt declines both flu vaccine and pneumonia vaccine. Encouraged healthy diet and exercise. Will check CBC, CMP, lipids, TSH with labs.    Relevant Orders      Comprehensive metabolic panel      Hemoglobin A1c      Lipid panel      Microalbumin / creatinine urine ratio      TSH      CBC with Differential   Screening for breast cancer   Relevant Orders      MM Digital Screening   Unspecified constipation     Secondary to narcotic use. Will try using Amitiza. Rx given today. Follow up in 6 months and prn.    Relevant Medications      lubiprostone (AMITIZA) capsule       Return in about 6 months (around 04/26/2014).

## 2013-10-24 NOTE — Assessment & Plan Note (Signed)
Strongly encouraged smoking cessation. Pt does not feel ready to quit.

## 2013-10-24 NOTE — Assessment & Plan Note (Signed)
Secondary to narcotic use. Will try using Amitiza. Rx given today. Follow up in 6 months and prn.

## 2013-10-24 NOTE — Progress Notes (Signed)
Pre visit review using our clinic review tool, if applicable. No additional management support is needed unless otherwise documented below in the visit note. 

## 2013-10-26 ENCOUNTER — Telehealth: Payer: Self-pay | Admitting: *Deleted

## 2013-10-26 NOTE — Telephone Encounter (Signed)
Still unable to contact pt by phone.  When called a busy signal is given.

## 2013-10-26 NOTE — Telephone Encounter (Signed)
[facility], however these can be affected by chronic narcotic use, and may not be accurate

## 2013-10-26 NOTE — Telephone Encounter (Signed)
[facility] will have to get authorization for the MRI that they ordered.

## 2013-10-26 NOTE — Telephone Encounter (Signed)
Pt would like to know if there is a test that she can have done to determine if she is going through menopause.

## 2013-10-26 NOTE — Telephone Encounter (Signed)
Spoke with pt advised of lab results.  Pt verbalized understanding.  Pt requests an authorization for the Lumbar MRI [facility]. pts phone disconnected during call unable to reach pt to finish request.

## 2013-10-29 NOTE — Telephone Encounter (Signed)
Notified pt. 

## 2013-11-05 NOTE — Telephone Encounter (Signed)
[facility] called and stated the patient and [facility] started a ref for the pt to have an MRI Lumbar.  However, they are unable to approve this procedure due to [facility] being out of network.  I do not see where an order was placed for an MRI Lumbar, however, [employer] stated to call back if she needs one done within network.

## 2013-11-29 ENCOUNTER — Telehealth: Payer: Self-pay

## 2013-11-29 DIAGNOSIS — M545 Low back pain: Secondary | ICD-10-CM

## 2013-11-29 NOTE — Telephone Encounter (Signed)
She needs [facility]

## 2013-11-29 NOTE — Telephone Encounter (Signed)
The patient called and is hoping to get a referral to a doctor regarding her back.

## 2013-11-30 NOTE — Telephone Encounter (Signed)
I have placed referral

## 2013-11-30 NOTE — Telephone Encounter (Signed)
Spoke with pt, states her insurance requires her PCP to make the referral

## 2013-11-30 NOTE — Telephone Encounter (Signed)
[facility] notified and [facility] scheduled

## 2014-03-25 ENCOUNTER — Other Ambulatory Visit: Payer: Self-pay | Admitting: Internal Medicine

## 2014-04-25 ENCOUNTER — Ambulatory Visit: Payer: Commercial Managed Care - HMO | Admitting: Internal Medicine

## 2014-06-27 ENCOUNTER — Ambulatory Visit: Payer: Commercial Managed Care - HMO | Admitting: Internal Medicine

## 2014-06-27 ENCOUNTER — Telehealth: Payer: Self-pay

## 2014-06-27 ENCOUNTER — Telehealth: Payer: Self-pay | Admitting: Internal Medicine

## 2014-06-27 NOTE — Telephone Encounter (Signed)
Please see phone note below.

## 2014-06-27 NOTE — Telephone Encounter (Signed)
Pt called to cancel appt this morning due to being sick. Please advise to cancel off schedule today/msn

## 2014-06-27 NOTE — Telephone Encounter (Signed)
Called the patient to inquire about her not showing up for her scheduled apt, no answer. Left voice message on machine.

## 2014-06-27 NOTE — Telephone Encounter (Signed)
No. Her last visit was a no-show. This will be charged as a no-show per our policy

## 2014-07-03 ENCOUNTER — Ambulatory Visit: Payer: Self-pay | Admitting: Specialist

## 2014-07-31 ENCOUNTER — Telehealth: Payer: Self-pay | Admitting: *Deleted

## 2014-07-31 NOTE — Telephone Encounter (Signed)
Erie NoeVanessa, Does this need a new referral? We placed one last August?

## 2014-07-31 NOTE — Telephone Encounter (Signed)
Pt left VM, needs referral placed for her insurance so she can continue to see Dr. Bing Reeavid Marks and Mardi MainlandAllison Taylor, PA at [facility].

## 2014-08-14 ENCOUNTER — Telehealth: Payer: Self-pay

## 2014-08-14 NOTE — Telephone Encounter (Signed)
[facility] called hoping to get a call back as soon as possible  Callback - [phone] (name - Lisbeth RenshawJanetta)

## 2014-11-04 ENCOUNTER — Other Ambulatory Visit: Payer: Self-pay | Admitting: Internal Medicine

## 2014-11-04 NOTE — Telephone Encounter (Signed)
Last OV 10/24/13 ok to fill?

## 2015-01-31 ENCOUNTER — Telehealth: Payer: Self-pay | Admitting: *Deleted

## 2015-01-31 NOTE — Telephone Encounter (Signed)
[facility], [phone],  left message on Triage line requesting a referral.  Message did not state what the referral was for.  Left vm for [name] to return call with this information

## 2016-02-24 ENCOUNTER — Emergency Department
Admission: EM | Admit: 2016-02-24 | Discharge: 2016-02-24 | Disposition: A | Discharge status: Home / Self Care | Payer: Medicare HMO | Attending: Emergency Medicine | Admitting: Emergency Medicine

## 2016-02-24 ENCOUNTER — Emergency Department: Payer: Medicare HMO

## 2016-02-24 ENCOUNTER — Encounter: Payer: Self-pay | Admitting: Emergency Medicine

## 2016-02-24 DIAGNOSIS — F17210 Nicotine dependence, cigarettes, uncomplicated: Secondary | ICD-10-CM | POA: Insufficient documentation

## 2016-02-24 DIAGNOSIS — Z79899 Other long term (current) drug therapy: Secondary | ICD-10-CM | POA: Diagnosis not present

## 2016-02-24 DIAGNOSIS — M545 Low back pain, unspecified: Secondary | ICD-10-CM

## 2016-02-24 NOTE — ED Notes (Signed)
Pt. Verbalizes understanding of d/c instructions and follow-up. VS stable and pain controlled per pt.  Pt. In NAD at time of d/c and denies further concerns regarding this visit. Pt. Stable at the time of departure from the unit, departing unit by the safest and most appropriate manner per that pt condition and limitations. Pt advised to return to the ED at any time for emergent concerns, or for new/worsening symptoms.   

## 2016-02-24 NOTE — ED Triage Notes (Signed)
Pt report lower back pain since Sunday. Reports hearing a "pop" when she bent over. Having pain radiating down both legs. Denies any trouble controlling her bowel or bladder. When asked to describe the pain, pt states, "It's broke"

## 2016-02-24 NOTE — ED Provider Notes (Signed)
[facility] Emergency Department Provider Note  ____________________________________________  Time seen: Approximately 8:35 PM  I have reviewed the triage vital signs and the nursing notes.   HISTORY  Chief Complaint Back Pain    HPI Brenda Conrad is a 50 y.o. female presents to the ED with constant, sharp low back pain for 3 days after bending over and hearing a "pop" in her low back.  States she is unable to stand up straight without pain and has to bed forward to walk. The pain radiates down both her legs to her knees.  States she took flexeril with relief. 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 Denies saddle anesthesia, incontinence of bowel or bladder, fever, stiff neck, headache, gross numbness/tingling, or gross weakness. Admits to loss of range of motion in her back and to focal numbness and tingling. States her pain is a 9/10 intensity.    Past Medical History:  Diagnosis Date  . Cataract   . Depression   . Elevated bilirubin   . Fatty liver   . Fibromyalgia 2007  . HLD (hyperlipidemia)   . Vitamin D deficiency     Patient Active Problem List   Diagnosis Date Noted  . Routine general medical examination at a health care facility 10/24/2013  . Unspecified constipation 10/24/2013  . Depression, major 05/05/2013  . Fibromyalgia 05/05/2013  . Elevated bilirubin 05/05/2013  . Screening for breast cancer 05/05/2013  . Special screening for malignant neoplasms, colon 05/05/2013  . Current smoker 01/10/2012  . Narcotic drug use 01/10/2012  . Headache, migraine 10/08/2011    Past Surgical History:  Procedure Laterality Date  . CHOLECYSTECTOMY    . PARTIAL HYSTERECTOMY    . VAGINAL DELIVERY     1    Prior to Admission medications   Medication Sig Start Date End Date Taking? Authorizing Provider  AMITIZA 24 MCG capsule TAKE 1 CAPSULE (24 MCG TOTAL) BY MOUTH DAILY WITH BREAKFAST. 11/04/14   Shelia MediaJennifer A  Walker, MD  citalopram (CELEXA) 20 MG tablet Take 30 mg by mouth daily.  04/12/13   Historical Provider, MD  clonazePAM Scarlette Calico(KLONOPIN) 1 MG tablet  06/26/12   Historical Provider, MD  cyclobenzaprine (FLEXERIL) 10 MG tablet Take 10 mg by mouth 3 (three) times daily as needed for muscle spasms.    Historical Provider, MD  dihydroergotamine (MIGRANAL) 4 MG/ML nasal spray Place 1 spray into the nose as needed for migraine. Use in one nostril as directed.  No more than 4 sprays in one hour    Historical Provider, MD  lactulose (CHRONULAC) 10 GM/15ML solution Take 10 g by mouth as needed for mild constipation.    Historical Provider, MD  morphine (MS CONTIN) 30 MG 12 hr tablet Take 30 mg by mouth 2 (two) times daily. 1 tab bid 06/15/12   Historical Provider, MD  Oxycodone HCl 10 MG TABS Take 10 mg by mouth 3 (three) times daily. i 1 tab tifd 06/15/12   Historical Provider, MD  traZODone (DESYREL) 50 MG tablet Take 50-150 mg by mouth at bedtime as needed for sleep.    Historical Provider, MD    Allergies Vicodin [hydrocodone-acetaminophen]  Family History  Problem Relation Age of Onset  . Heart disease Mother   . Heart disease Father   . Breast cancer Sister   . Heart disease Brother     Social History Social History  Substance Use Topics  . Smoking status: Current Every Day Smoker  Types: Cigarettes  . Smokeless tobacco: Never Used  . Alcohol use No     Review of Systems  Constitutional: No fever/chills Eyes: No visual changes. Cardiovascular: no chest pain. Respiratory:  No SOB. Gastrointestinal: No abdominal pain.  No nausea, no vomiting.  No diarrhea.  No constipation. Musculoskeletal: positive for low back pain.  Skin: Negative for rash, abrasions, lacerations, ecchymosis. Neurological: Negative for headaches, gross numbness and tingling. Positive for focal numbness/tingling.  10-point ROS otherwise negative.  ____________________________________________   PHYSICAL EXAM:  VITAL  SIGNS: ED Triage Vitals  Enc Vitals Group     BP 02/24/16 1922 112/65     Pulse Rate 02/24/16 1922 61     Resp --      Temp 02/24/16 1922 98.7 F (37.1 C)     Temp Source 02/24/16 1922 Oral     SpO2 02/24/16 1922 95 %     Weight 02/24/16 1924 220 lb (99.8 kg)     Height 02/24/16 1924 5\' 8"  (1.727 m)     Head Circumference --      Peak Flow --      Pain Score 02/24/16 1924 10     Pain Loc --      Pain Edu? --      Excl. in GC? --      Constitutional: Alert and oriented. Well appearing and in no acute distress. Eyes: Conjunctivae are normal.  Head: Atraumatic. Cardiovascular: Normal rate, regular rhythm. No murmurs, rubs or gallops Normal S1 and S2.  Good peripheral circulation. Respiratory: Normal respiratory effort without tachypnea or retractions. Lungs CTAB. Good air entry to the bases with no decreased or absent breath sounds. Musculoskeletal: Nontender to palpation along lumbar and thoracic spine. AROM normal in BIL hips and BIL knees. Limited ROM in back due to the pain. Straight leg raise negative. Sensation and pulses distally are intact. No palpable deformities or step offs.  Neurologic:  Normal speech and language. No gross focal neurologic deficits are appreciated.  Skin:  Skin is warm, dry and intact. No rash noted. Psychiatric: Mood and affect are normal. Speech and behavior are normal. Patient exhibits appropriate insight and judgement.   ____________________________________________   LABS (all labs ordered are listed, but only abnormal results are displayed)  Labs Reviewed - No data to display ____________________________________________  EKG   ____________________________________________  RADIOLOGYI, Delorise Royals Davionne Mastrangelo, personally viewed and evaluated these images (plain radiographs) as part of my medical decision making, as well as reviewing the written report by the radiologist  Dg Lumbar Spine Complete  Result Date: 02/24/2016 CLINICAL DATA:  Low  back pain after bending over 2 days ago, felt a pop. Pain radiates to bilateral lower extremities. EXAM: LUMBAR SPINE - COMPLETE 4+ VIEW COMPARISON:  None. FINDINGS: Five non rib-bearing lumbar-type vertebral bodies are intact and aligned with maintenance of the lumbar lordosis. Intervertebral disc heights are normal. No destructive bony lesions. No pars interarticularis defects . Mild L5-S1 facet arthropathy. Sacroiliac joints are symmetric. Included prevertebral and paraspinal soft tissue planes are non-suspicious. Surgical clips in the included right abdomen compatible with cholecystectomy. IMPRESSION: Negative lumbar spine radiographs. Electronically Signed   By: Awilda Metro M.D.   On: 02/24/2016 21:30    ____________________________________________    PROCEDURES  Procedure(s) performed:    Procedures    Medications - No data to display   ____________________________________________   INITIAL IMPRESSION / ASSESSMENT AND PLAN / ED COURSE  Pertinent labs & imaging results that were available during my care  of the patient were reviewed by me and considered in my medical decision making (see chart for details).  Review of the [place] CSRS was performed in accordance of the [facility] prior to dispensing any controlled drugs.  Clinical Course     Patient's diagnosis is consistent with low back pain radiating to her knees BIL. X-ray shows no indication for fracture or misalignment.  Patient will be discharged home with instructions to take Flexeril that she already has at home for pain. Patient is to follow up with orthopedics for further work up and MRI scan.  Patient is given ED precautions to return to the ED for any worsening or new symptoms.     ____________________________________________  FINAL CLINICAL IMPRESSION(S) / ED DIAGNOSES  Final diagnoses:  Acute bilateral low back pain without sciatica      NEW MEDICATIONS STARTED DURING THIS VISIT:  New Prescriptions   No  medications on file        This chart was dictated using voice recognition software/Dragon. Despite best efforts to proofread, errors can occur which can change the meaning. Any change was purely unintentional.   Racheal PatchesJonathan D Miaisabella Bacorn, PA-C 02/24/16 2256    Minna AntisKevin Paduchowski, MD 02/24/16 [phone]

## 2016-12-31 ENCOUNTER — Emergency Department: Payer: Medicare HMO

## 2016-12-31 DIAGNOSIS — F17210 Nicotine dependence, cigarettes, uncomplicated: Secondary | ICD-10-CM | POA: Insufficient documentation

## 2016-12-31 DIAGNOSIS — Z79899 Other long term (current) drug therapy: Secondary | ICD-10-CM | POA: Insufficient documentation

## 2016-12-31 DIAGNOSIS — R0789 Other chest pain: Secondary | ICD-10-CM | POA: Diagnosis not present

## 2016-12-31 LAB — BASIC METABOLIC PANEL
Anion gap: 9 (ref 5–15)
BUN: 16 mg/dL (ref 6–20)
CALCIUM: 9.5 mg/dL (ref 8.9–10.3)
CO2: 26 mmol/L (ref 22–32)
CREATININE: 1.1 mg/dL — AB (ref 0.44–1.00)
Chloride: 104 mmol/L (ref 101–111)
GFR calc Af Amer: >60 mL/min (ref >60)
GFR calc non Af Amer: 56 mL/min — ABNORMAL LOW (ref >60)
GLUCOSE: 104 mg/dL — AB (ref 65–99)
Potassium: 3.6 mmol/L (ref 3.5–5.1)
Sodium: 139 mmol/L (ref 135–145)

## 2016-12-31 LAB — CBC
HCT: 40.3 % (ref 35.0–47.0)
Hemoglobin: 14.3 g/dL (ref 12.0–16.0)
MCH: 32 pg (ref 26.0–34.0)
MCHC: 35.6 g/dL (ref 32.0–36.0)
MCV: 89.8 fL (ref 80.0–100.0)
PLATELETS: 174 10*3/uL (ref 150–440)
RBC: 4.49 MIL/uL (ref 3.80–5.20)
RDW: 12.4 % (ref 11.5–14.5)
WBC: 9.5 10*3/uL (ref 3.6–11.0)

## 2016-12-31 LAB — TROPONIN I

## 2016-12-31 NOTE — ED Triage Notes (Signed)
Pt presents via POV c/o centralized chest pain x3 week. Pt states pain has been constant x3 days. Denies N/V. Denies cardiac history.

## 2017-01-01 ENCOUNTER — Emergency Department
Admission: EM | Admit: 2017-01-01 | Discharge: 2017-01-01 | Disposition: A | Discharge status: Home / Self Care | Payer: Medicare HMO | Attending: Emergency Medicine | Admitting: Emergency Medicine

## 2017-01-01 ENCOUNTER — Encounter: Payer: Self-pay | Admitting: Emergency Medicine

## 2017-01-01 DIAGNOSIS — R0789 Other chest pain: Secondary | ICD-10-CM

## 2017-01-01 NOTE — ED Provider Notes (Signed)
[facility] Emergency Department Provider Note  ____________________________________________   First MD Initiated Contact with Patient 01/01/17 0309     (approximate)  I have reviewed the triage vital signs and the nursing notes.   HISTORY  Chief Complaint Chest Pain    HPI Brenda Conrad is a 51 y.o. female With medical history as listed below and presents for evaluation of intermittent central aching chest pain 3 weeks which has been constantfor about 3 days. She is not having any shortness of breath.  She denies fever/chills, cough, nasal congestion, nausea, vomiting, abdominal pain, and dysuria.  Nothing makes it better and nothing makes it worse and it is not reproducible with movement nor with palpation.  She and her husband report that she has been under a lot of stress recently with a new grandbaby and issues  20 family members.  The patient reports that she thinks it is anxiety that is causing her chest pain but she wanted to make sure it is not her heart because she does have a strong family history of early heart attacks including her brother who reportedly died of a massive heart attack at age 11.  The patient smokes but denies hypertension, diabetes, and hypercholesterolemia.  She has not had any immobilizations recently, no surgeries, no long travel, and does not take exogenous estrogen.  Past Medical History:  Diagnosis Date  . Cataract   . Depression   . Elevated bilirubin   . Fatty liver   . Fibromyalgia 2007   reports she is on disability due to fibromyalgia  . HLD (hyperlipidemia)   . Vitamin D deficiency     Patient Active Problem List   Diagnosis Date Noted  . Routine general medical examination at a health care facility 10/24/2013  . Unspecified constipation 10/24/2013  . Depression, major 05/05/2013  . Fibromyalgia 05/05/2013  . Elevated bilirubin 05/05/2013  . Screening for breast cancer 05/05/2013  . Special screening for  malignant neoplasms, colon 05/05/2013  . Current smoker 01/10/2012  . Narcotic drug use 01/10/2012  . Headache, migraine 10/08/2011    Past Surgical History:  Procedure Laterality Date  . CHOLECYSTECTOMY    . PARTIAL HYSTERECTOMY    . VAGINAL DELIVERY     1    Prior to Admission medications   Medication Sig Start Date End Date Taking? Authorizing Provider  AMITIZA 24 MCG capsule TAKE 1 CAPSULE (24 MCG TOTAL) BY MOUTH DAILY WITH BREAKFAST. 11/04/14   Shelia Media, MD  citalopram (CELEXA) 20 MG tablet Take 30 mg by mouth daily.  04/12/13   [provider]  clonazePAM Scarlette Calico) 1 MG tablet  06/26/12   [provider]  cyclobenzaprine (FLEXERIL) 10 MG tablet Take 10 mg by mouth 3 (three) times daily as needed for muscle spasms.    [provider]  dihydroergotamine (MIGRANAL) 4 MG/ML nasal spray Place 1 spray into the nose as needed for migraine. Use in one nostril as directed.  No more than 4 sprays in one hour    [provider]  lactulose (CHRONULAC) 10 GM/15ML solution Take 10 g by mouth as needed for mild constipation.    [provider]  morphine (MS CONTIN) 30 MG 12 hr tablet Take 30 mg by mouth 2 (two) times daily. 1 tab bid 06/15/12   [provider]  Oxycodone HCl 10 MG TABS Take 10 mg by mouth 3 (three) times daily. i 1 tab tifd 06/15/12   [provider]  traZODone (DESYREL) 50 MG tablet Take 50-150 mg by mouth at bedtime as needed for sleep.    [provider]    Allergies Vicodin [hydrocodone-acetaminophen]  Family History  Problem Relation Age of Onset  . Heart disease Mother   . Heart disease Father   . Breast cancer Sister   . Heart disease Brother     Social History Social History  Substance Use Topics  . Smoking status: Current Every Day Smoker    Types: Cigarettes  . Smokeless tobacco: Never Used  . Alcohol use No    Review of Systems Constitutional: No fever/chills Eyes: No visual  changes. ENT: No sore throat. Cardiovascular: +chest pain x 3 weeks Respiratory: Denies shortness of breath. Gastrointestinal: No abdominal pain.  No nausea, no vomiting.  No diarrhea.  No constipation. Genitourinary: Negative for dysuria. Musculoskeletal: Negative for neck pain.  Negative for back pain. Integumentary: Negative for rash. Neurological: Negative for headaches, focal weakness or numbness.   ____________________________________________   PHYSICAL EXAM:  VITAL SIGNS: ED Triage Vitals  Enc Vitals Group     BP 12/31/16 2220 (!) 120/48     Pulse Rate 12/31/16 2220 75     Resp 12/31/16 2220 14     Temp 12/31/16 2220 99 F (37.2 C)     Temp Source 12/31/16 2220 Oral     SpO2 12/31/16 2220 96 %     Weight 12/31/16 2221 90.7 kg (200 lb)     Height 12/31/16 2221 1.727 m ( )     Head Circumference --      Peak Flow --      Pain Score 12/31/16 2220 5     Pain Loc --      Pain Edu? --      Excl. in GC? --     Constitutional: Alert and oriented. Well appearing and in no acute distress. Eyes: Conjunctivae are normal.  Head: Atraumatic. Nose: No congestion/rhinnorhea. Mouth/Throat: Mucous membranes are moist. Neck: No stridor.  No meningeal signs.   Cardiovascular: Normal rate, regular rhythm. Good peripheral circulation. Grossly normal heart sounds.no reproducible chest wall tenderness Respiratory: Normal respiratory effort.  No retractions. Lungs CTAB. Gastrointestinal: Soft and nontender. No distention.  Musculoskeletal: No lower extremity tenderness nor edema. No gross deformities of extremities. Neurologic:  Normal speech and language. No gross focal neurologic deficits are appreciated.  Skin:  Skin is warm, dry and intact. No rash noted. Psychiatric: Mood and affect are normal. Speech and behavior are normal.  ____________________________________________   LABS (all labs ordered are listed, but only abnormal results are displayed)  Labs Reviewed  BASIC  METABOLIC PANEL - Abnormal; Notable for the following:       Result Value   Glucose, Bld 104 (*)    Creatinine, Ser 1.10 (*)    GFR calc non Af Amer 56 (*)    All other components within normal limits  CBC  TROPONIN I   ____________________________________________  EKG  ED ECG REPORT I, Dondra Rhett, the attending physician, personally viewed and interpreted this ECG.  Date: 12/31/2016 EKG Time: 22:11 Rate: 68 Rhythm: normal sinus rhythm QRS Axis: normal Intervals: normal ST/T Wave abnormalities: normal Narrative Interpretation: no evidence of acute ischemia  ____________________________________________  RADIOLOGY   Dg Chest 2 View  Result Date: 12/31/2016 CLINICAL DATA:  Centralized chest pain for 3 weeks. Constant pain for 3 days. EXAM: CHEST  2 VIEW COMPARISON:  None. FINDINGS: Normal heart size and pulmonary vascularity. Mild linear scarring suggested in  the left lung base. No airspace disease or consolidation in the lungs. No blunting of costophrenic angles. No pneumothorax. Mediastinal contours appear intact. Surgical clips in the right upper quadrant. IMPRESSION: Focal scarring in the left lung base. No evidence of active pulmonary disease. Electronically Signed   By: William Burman Nieves   On: 12/31/2016 22:41    ____________________________________________   PROCEDURES  Critical Care performed: No   Procedure(s) performed:   Procedures   ____________________________________________   INITIAL IMPRESSION / ASSESSMENT AND PLAN / ED COURSE  Pertinent labs & imaging results that were available during my care of the patient were reviewed by me and considered in my medical decision making (see chart for details).   I reviewed the patient's prior records in the EMR. her lab work is all reassuring with no acute abnormalities.  Her vital signs are stable.  Her EKG shows no sign of acute ischemia and her  chest x-ray does not show any acute findings.  Differential diagnosis includes, but is not limited to, ACS, aortic dissection, pulmonary embolism, cardiac tamponade, pneumothorax, pneumonia, pericarditis/myocarditis, GI-related causes including esophagitis/gastritis, and musculoskeletal chest wall pain.    However, in this particular patient's case I think it is most likely she is having chest pain related to anxiety and stress. Low risk HEART score of 2, Wells Score for PE of zero.  I encouraged her to follow up both with her regular doctor to discuss the anxiety component but also with the cardiologist for further outpatient testing, but given the duration of her symptoms and her low risk there is no indication for a second troponin at this time.  I will recommend that she take a baby aspirin every day but I do not think she needs a full dose aspirin at this time.  She agrees with the plan and does not feel she needs any additional workup either.  I gave my usual and customary return precautions.       ____________________________________________  FINAL CLINICAL IMPRESSION(S) / ED DIAGNOSES  Final diagnoses:  Atypical chest pain     MEDICATIONS GIVEN DURING THIS VISIT:  Medications - No data to display   NEW OUTPATIENT MEDICATIONS STARTED DURING THIS VISIT:  New Prescriptions   No medications on file    Modified Medications   No medications on file    Discontinued Medications   No medications on file     Note:  This document was prepared using Dragon voice recognition software and may include unintentional dictation errors.    Loleta Rose, MD 01/01/17 [phone]

## 2017-01-01 NOTE — Discharge Instructions (Signed)
You have been seen in the Emergency Department (ED) today for chest pain.  As we have discussed today?s test results are normal, but you may require further testing.  Most likely your symptoms are a result of stress and anxiety, but given your family history and personal smoking history, additional cardiology evaluation as an outpatient is appropriate.  Please follow up with the recommended doctor as instructed above in these documents regarding today?s emergent visit and your recent symptoms to discuss further management.  Continue to take your regular medications. If you are not doing so already, please also take a daily baby aspirin (81 mg), at least until you follow up with your doctor.  Return to the Emergency Department (ED) if you experience any further chest pain/pressure/tightness, difficulty breathing, or sudden sweating, or other symptoms that concern you.

## 2017-01-01 NOTE — ED Notes (Signed)
Patient c/o intermittent central chest pain, with increasing severity/frequency today. Pt reports concurrent symptoms of SOB and weakness. Pt denies radiation, nausea, emesis, lightheadedness/dizziness.

## 2018-04-07 IMAGING — CR DG CHEST 2V
1 series · 2 of 2 positions shown · non-contrast
Comparison: None.

CLINICAL DATA: Centralized chest pain for 3 weeks. Constant pain
for 3 days.

EXAM:
CHEST  2 VIEW

[Series 1: dg chest 2 view · 0.14mm/px · 2 of 2 slices shown]
[im 1/2]
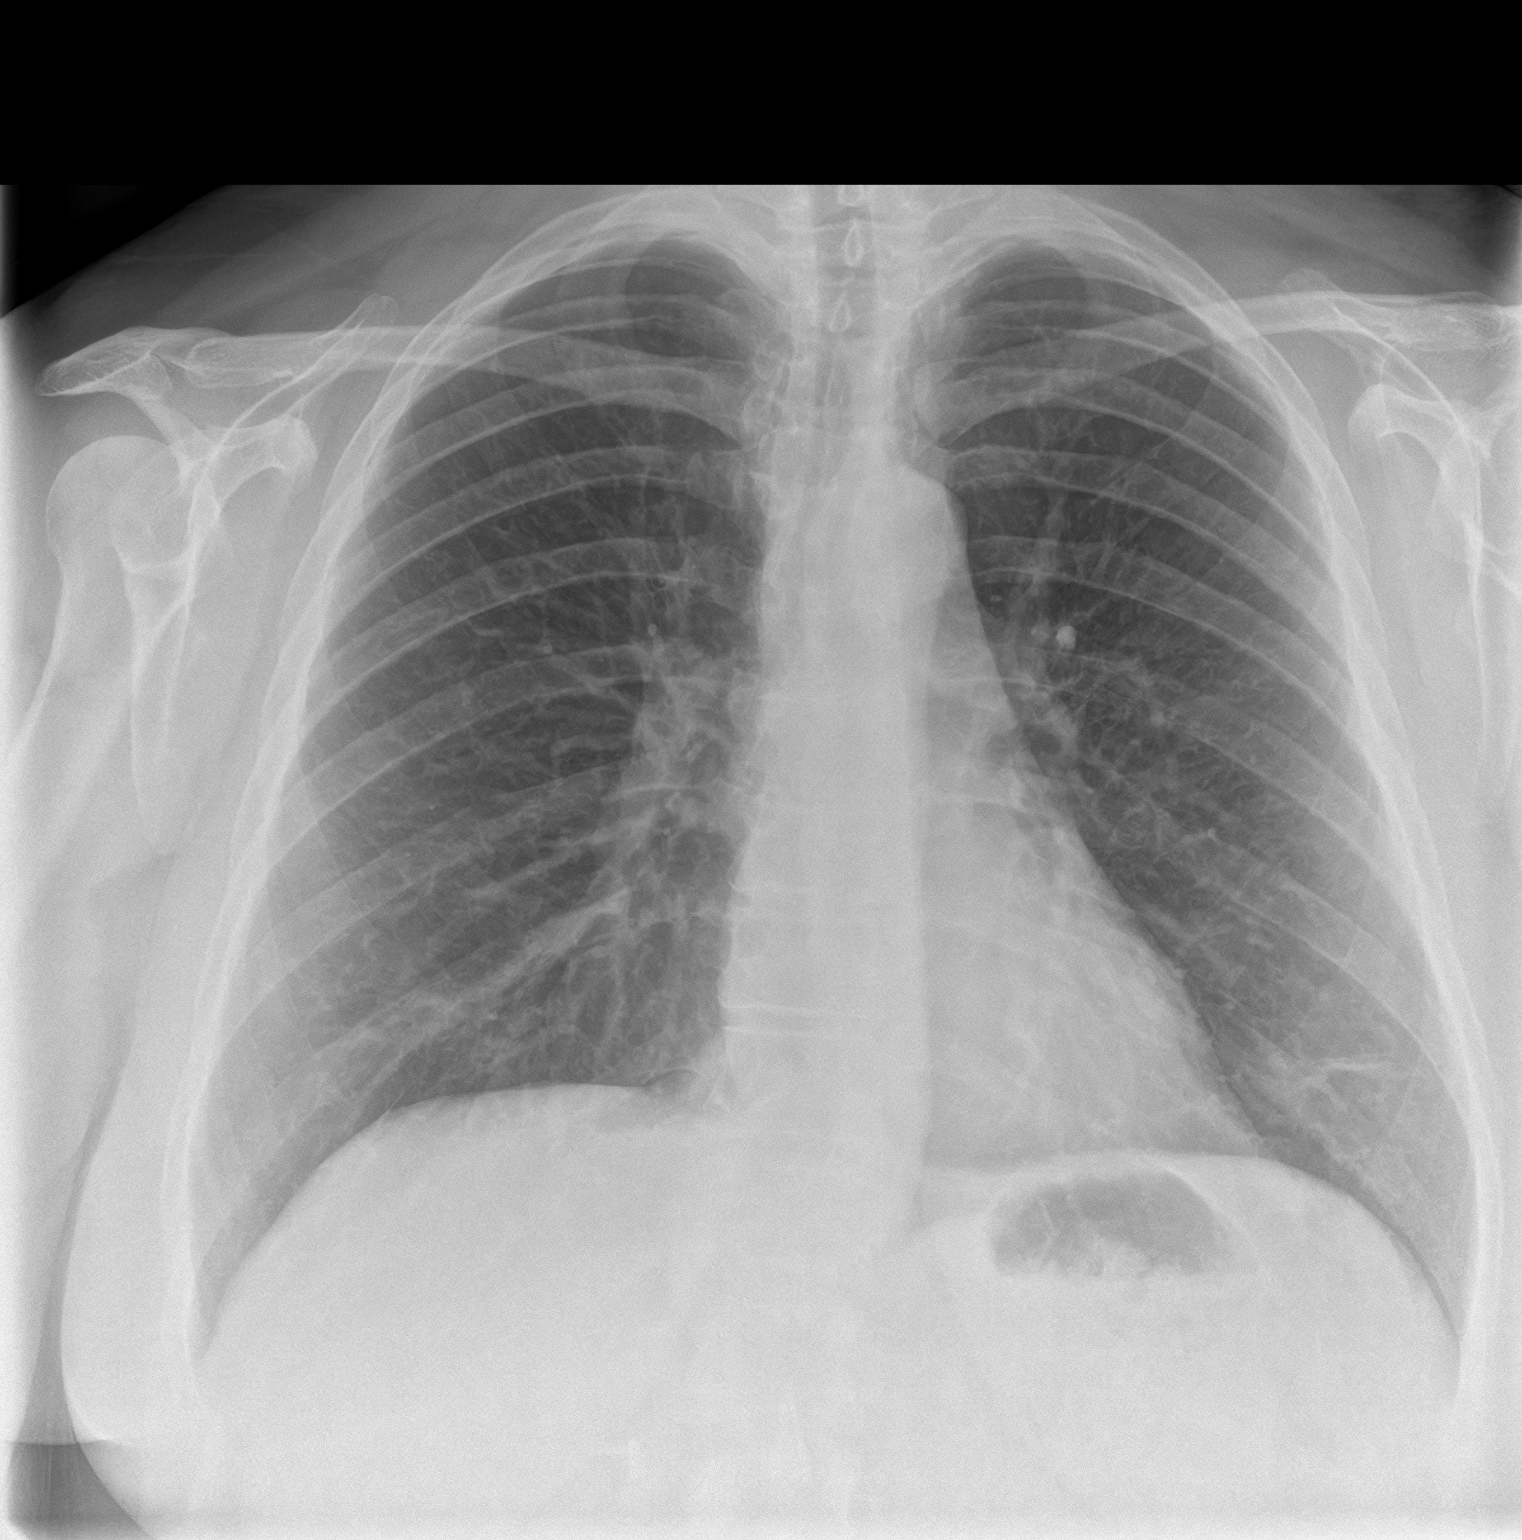
[im 2/2]
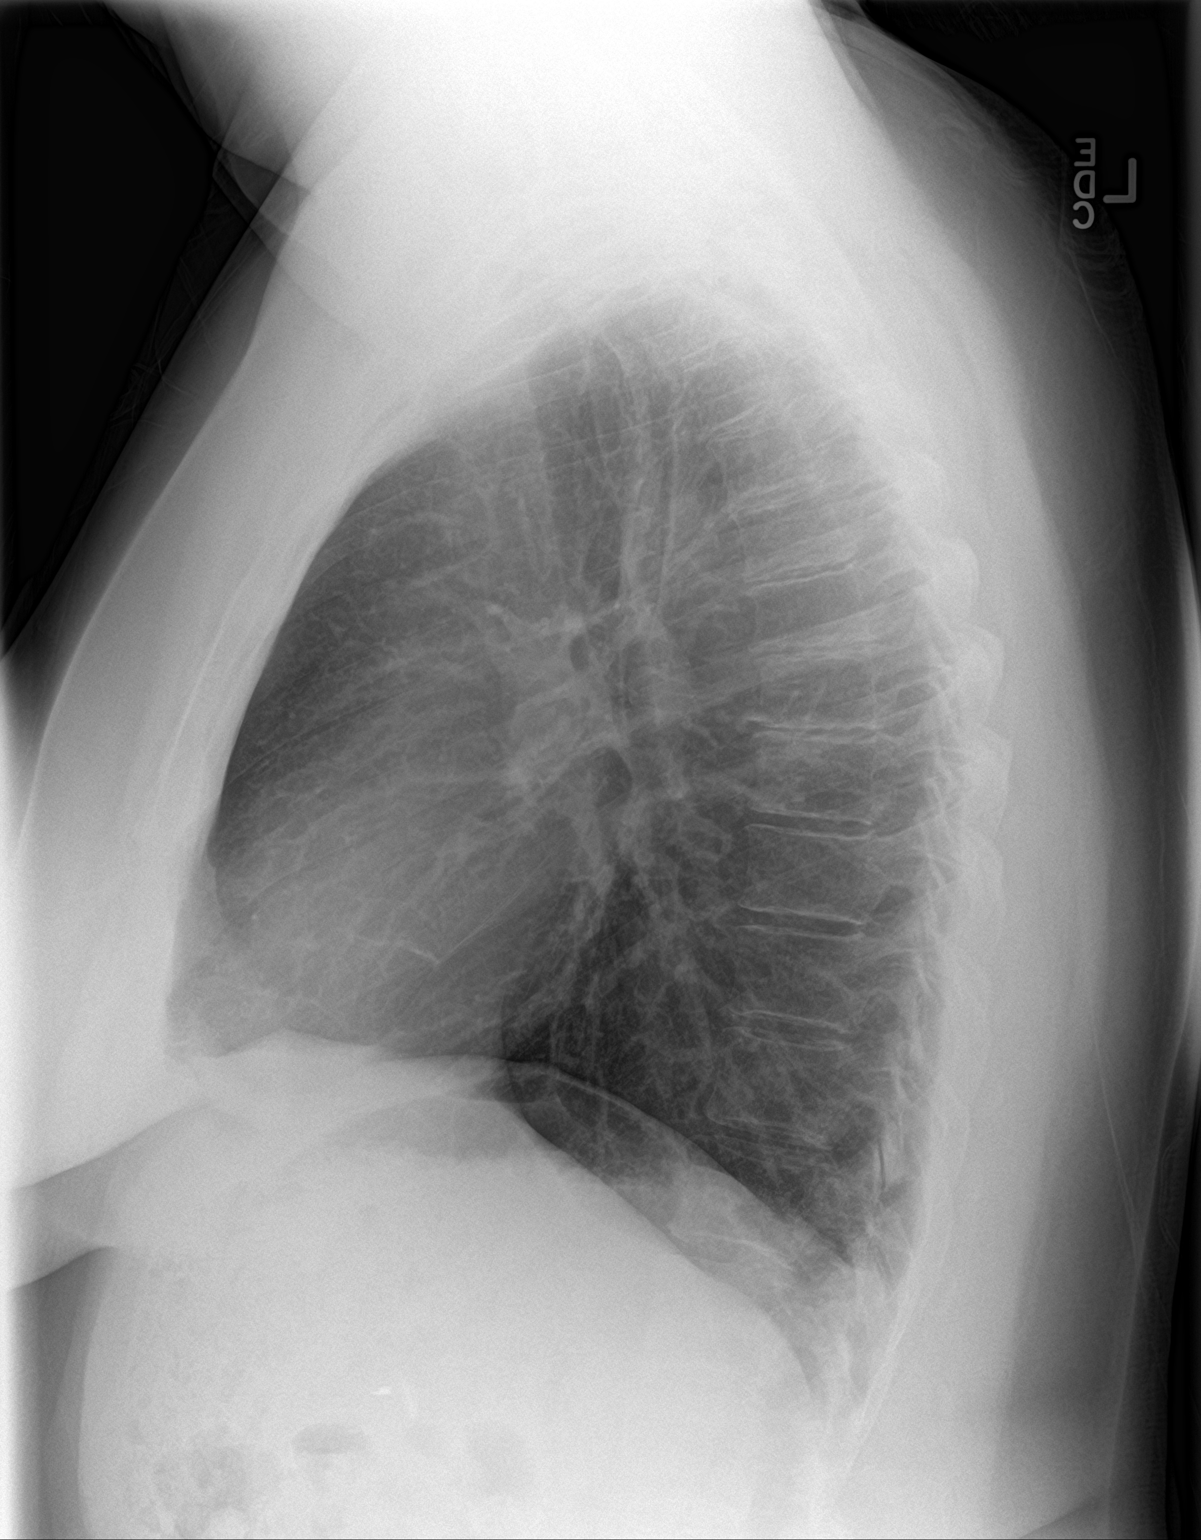

[2 of 2 positions shown; findings below may reference images not displayed]

FINDINGS: Normal heart size and pulmonary vascularity. Mild linear scarring
suggested in the left lung base. No airspace disease or
consolidation in the lungs. No blunting of costophrenic angles. No
pneumothorax. Mediastinal contours appear intact. Surgical clips in
the right upper quadrant.
IMPRESSION: Focal scarring in the left lung base. No evidence of active
pulmonary disease.

## 2020-02-05 ENCOUNTER — Ambulatory Visit: Payer: Medicare HMO | Admitting: Dermatology

## 2020-02-05 ENCOUNTER — Other Ambulatory Visit: Payer: Self-pay

## 2020-02-05 DIAGNOSIS — L578 Other skin changes due to chronic exposure to nonionizing radiation: Secondary | ICD-10-CM

## 2020-02-05 DIAGNOSIS — L821 Other seborrheic keratosis: Secondary | ICD-10-CM | POA: Diagnosis not present

## 2020-02-05 DIAGNOSIS — L820 Inflamed seborrheic keratosis: Secondary | ICD-10-CM | POA: Diagnosis not present

## 2020-02-05 DIAGNOSIS — L409 Psoriasis, unspecified: Secondary | ICD-10-CM | POA: Diagnosis not present

## 2020-02-05 MED ORDER — ENSTILAR 0.005-0.064 % EX FOAM: CUTANEOUS | 2 refills | Status: DC

## 2020-02-05 NOTE — Progress Notes (Signed)
   Follow-Up Visit   Subjective  Brenda Conrad is a 54 y.o. female who presents for the following: Skin Problem (check spots on the L shoulder growing and changing color) and Rash (rash on the R leg for several years, pt report family hx of psoriasis ).  The following portions of the chart were reviewed this encounter and updated as appropriate:  Tobacco  Allergies  Meds  Problems  Med Hx  Surg Hx  Fam Hx     Review of Systems:  No other skin or systemic complaints except as noted in HPI or Assessment and Plan.  Objective  Well appearing patient in no apparent distress; mood and affect are within normal limits.  A focused examination was performed including face, shoulder . Relevant physical exam findings are noted in the Assessment and Plan.  Objective  Left shoulder: Erythematous keratotic or waxy stuck-on papule or plaque.   Objective  Right pretibia: 5.0 cm Scale and dyschromia and pinkness    Assessment & Plan  Inflamed seborrheic keratosis Left shoulder  Destruction of lesion - Left shoulder Complexity: simple   Destruction method: cryotherapy   Informed consent: discussed and consent obtained   Timeout:  patient name, date of birth, surgical site, and procedure verified Lesion destroyed using liquid nitrogen: Yes   Region frozen until ice ball extended beyond lesion: Yes   Outcome: patient tolerated procedure well with no complications   Post-procedure details: wound care instructions given    Psoriasis Right pretibia Psoriasis   Start Enstilar foam apply to skin bid x 2 weeks then decrease to 5 nights a week prn  If too expensive we will change to Taclonex solution   Ordered Medications: Calcipotriene-Betameth Diprop (ENSTILAR) 0.005-0.064 % FOAM  Actinic Damage - diffuse scaly erythematous macules with underlying dyspigmentation - Recommend daily broad spectrum sunscreen SPF 30+ to sun-exposed areas, reapply every 2 hours as needed.  - Call for  new or changing lesions.  Seborrheic Keratoses - Stuck-on, waxy, tan-brown papules and plaques  - Discussed benign etiology and prognosis. - Observe - Call for any changes  Return in about 4 months (around 06/07/2020).  IAngelique Holm, CMA, am acting as scribe for Armida Sans, MD .  Documentation: I have reviewed the above documentation for accuracy and completeness, and I agree with the above.  Armida Sans, MD

## 2020-02-06 ENCOUNTER — Encounter: Payer: Self-pay | Admitting: Dermatology

## 2020-02-12 ENCOUNTER — Other Ambulatory Visit: Payer: Self-pay

## 2020-02-12 MED ORDER — CALCIPOTRIENE-BETAMETH DIPROP 0.005-0.064 % EX SUSP: CUTANEOUS | 1 refills | Status: DC

## 2020-02-12 NOTE — Progress Notes (Signed)
Taclonex sent in as replacement to Enstilar foam per last office note.

## 2020-05-12 ENCOUNTER — Telehealth: Payer: Self-pay

## 2020-05-12 MED ORDER — CALCIPOTRIENE-BETAMETH DIPROP 0.005-0.064 % EX OINT: TOPICAL_OINTMENT | Freq: Every day | CUTANEOUS | 0 refills | Status: DC

## 2020-05-12 NOTE — Telephone Encounter (Signed)
Pt called to let Dr Kirtland Bouchard know Dannielle Burn foam is not covered by her insurance, okay we will erx Taclonex ointment to [facility] discussed with pt

## 2020-05-15 ENCOUNTER — Telehealth: Payer: Self-pay

## 2020-05-15 NOTE — Telephone Encounter (Signed)
Enstilar foam or Taclonex is not covered by insurance, pt would like a rx her insurance covered

## 2020-05-19 MED ORDER — MOMETASONE FUROATE 0.1 % EX CREA: 1.0000 "application " | TOPICAL_CREAM | CUTANEOUS | 3 refills | Status: DC

## 2020-05-19 MED ORDER — CALCIPOTRIENE 0.005 % EX CREA: TOPICAL_CREAM | CUTANEOUS | 6 refills | Status: DC

## 2020-05-19 NOTE — Addendum Note (Signed)
Addended by: Dorathy Daft R on: 05/19/2020 03:54 PM   Modules accepted: Orders

## 2020-05-19 NOTE — Telephone Encounter (Signed)
Alternatives sent in and left message advising patient of change.

## 2020-05-19 NOTE — Telephone Encounter (Signed)
May send : 1- Calcipotriene cr qd to aa of psoriasis rash 7 days per week as needed  Disp trade with 6 rf  2- Mometasone cream qd up to 5 days per week prn  to aa psoriasis rash until clear Disp trade - 60 g with 3 rf

## 2020-05-27 ENCOUNTER — Ambulatory Visit: Payer: Medicare HMO | Admitting: Dermatology

## 2020-05-29 ENCOUNTER — Other Ambulatory Visit: Payer: Self-pay | Admitting: Dermatology

## 2020-06-05 ENCOUNTER — Other Ambulatory Visit: Payer: Self-pay

## 2020-06-05 MED ORDER — MOMETASONE FUROATE 0.1 % EX CREA: 1.0000 "application " | TOPICAL_CREAM | Freq: Every day | CUTANEOUS | 0 refills | Status: AC

## 2020-06-05 MED ORDER — CALCIPOTRIENE 0.005 % EX CREA: TOPICAL_CREAM | Freq: Every day | CUTANEOUS | 3 refills | Status: AC

## 2020-06-05 NOTE — Progress Notes (Signed)
New RX RFs sent into [facility].

## 2020-07-22 ENCOUNTER — Emergency Department
Admission: EM | Admit: 2020-07-22 | Discharge: 2020-07-22 | Disposition: A | Discharge status: Home / Self Care | Payer: Medicare HMO | Attending: Emergency Medicine | Admitting: Emergency Medicine

## 2020-07-22 ENCOUNTER — Other Ambulatory Visit: Payer: Self-pay

## 2020-07-22 ENCOUNTER — Emergency Department: Payer: Medicare HMO

## 2020-07-22 DIAGNOSIS — F17210 Nicotine dependence, cigarettes, uncomplicated: Secondary | ICD-10-CM | POA: Diagnosis not present

## 2020-07-22 DIAGNOSIS — R1084 Generalized abdominal pain: Secondary | ICD-10-CM | POA: Insufficient documentation

## 2020-07-22 DIAGNOSIS — R112 Nausea with vomiting, unspecified: Secondary | ICD-10-CM | POA: Insufficient documentation

## 2020-07-22 LAB — COMPREHENSIVE METABOLIC PANEL
ALT: 149 U/L — ABNORMAL HIGH (ref 0–44)
AST: 254 U/L — ABNORMAL HIGH (ref 15–41)
Albumin: 3.9 g/dL (ref 3.5–5.0)
Alkaline Phosphatase: 156 U/L — ABNORMAL HIGH (ref 38–126)
Anion gap: 8 (ref 5–15)
BUN: 20 mg/dL (ref 6–20)
CO2: 25 mmol/L (ref 22–32)
Calcium: 8.8 mg/dL — ABNORMAL LOW (ref 8.9–10.3)
Chloride: 102 mmol/L (ref 98–111)
Creatinine, Ser: 0.86 mg/dL (ref 0.44–1.00)
GFR, Estimated: >60 mL/min (ref >60)
Glucose, Bld: 123 mg/dL — ABNORMAL HIGH (ref 70–99)
Potassium: 3.6 mmol/L (ref 3.5–5.1)
Sodium: 135 mmol/L (ref 135–145)
Total Bilirubin: 1.5 mg/dL — ABNORMAL HIGH (ref 0.3–1.2)
Total Protein: 6.4 g/dL — ABNORMAL LOW (ref 6.5–8.1)

## 2020-07-22 LAB — CBC
HCT: 36.4 % (ref 36.0–46.0)
Hemoglobin: 12.8 g/dL (ref 12.0–15.0)
MCH: 32.2 pg (ref 26.0–34.0)
MCHC: 35.2 g/dL (ref 30.0–36.0)
MCV: 91.5 fL (ref 80.0–100.0)
Platelets: 135 10*3/uL — ABNORMAL LOW (ref 150–400)
RBC: 3.98 MIL/uL (ref 3.87–5.11)
RDW: 11.6 % (ref 11.5–15.5)
WBC: 4.2 10*3/uL (ref 4.0–10.5)
nRBC: 0 % (ref 0.0–0.2)

## 2020-07-22 LAB — LIPASE, BLOOD: Lipase: 47 U/L (ref 11–51)

## 2020-07-22 LAB — TROPONIN I (HIGH SENSITIVITY): Troponin I (High Sensitivity): 3 ng/L (ref <18)

## 2020-07-22 MED ORDER — ONDANSETRON 4 MG PO TBDP: 4.0000 mg | ORAL_TABLET | Freq: Three times a day (TID) | ORAL | 0 refills | Status: AC | PRN

## 2020-07-22 MED ORDER — SODIUM CHLORIDE 0.9 % IV BOLUS
1000.0000 mL | Freq: Once | INTRAVENOUS | Status: AC
  Administered 2020-07-22: 1000 mL via INTRAVENOUS

## 2020-07-22 MED ORDER — DICYCLOMINE HCL 10 MG PO CAPS: 10.0000 mg | ORAL_CAPSULE | Freq: Four times a day (QID) | ORAL | 0 refills | Status: AC | PRN

## 2020-07-22 MED ORDER — ONDANSETRON HCL 4 MG/2ML IJ SOLN
4.0000 mg | Freq: Once | INTRAMUSCULAR | Status: AC
  Administered 2020-07-22: 4 mg via INTRAVENOUS
  Filled 2020-07-22: qty 2

## 2020-07-22 MED ORDER — FENTANYL CITRATE (PF) 100 MCG/2ML IJ SOLN
50.0000 ug | Freq: Once | INTRAMUSCULAR | Status: AC
  Administered 2020-07-22: 50 ug via INTRAVENOUS
  Filled 2020-07-22: qty 2

## 2020-07-22 NOTE — ED Notes (Signed)
Pt given water and saltine crackers at this time. Call bell within reach if pt becomes nauseous.

## 2020-07-22 NOTE — ED Provider Notes (Signed)
[facility] Emergency Department Provider Note   ____________________________________________   Event Date/Time   First MD Initiated Contact with Patient 07/22/20 0622     (approximate)  I have reviewed the triage vital signs and the nursing notes.   HISTORY  Chief Complaint Nausea and Abdominal Pain    HPI Brenda Conrad is a 55 y.o. female who presents to the ED via EMS from home with a chief complaint of abdominal pain, nausea and vomiting.  Patient states she hurt her back approximately 3 weeks ago and has been taking pain medications.  Has not had a good bowel movement in that time.  Did have a tiny hard stool yesterday.  Yesterday mid afternoon began to have generalized abdominal pain, nausea and vomiting.  Denies fever, chills, cough, chest pain, shortness of breath, dysuria.     Past Medical History:  Diagnosis Date  . Cataract   . Depression   . Elevated bilirubin   . Fatty liver   . Fibromyalgia 2007   reports she is on disability due to fibromyalgia  . HLD (hyperlipidemia)   . Vitamin D deficiency     Patient Active Problem List   Diagnosis Date Noted  . Routine general medical examination at a health care facility 10/24/2013  . Unspecified constipation 10/24/2013  . Depression, major 05/05/2013  . Fibromyalgia 05/05/2013  . Elevated bilirubin 05/05/2013  . Screening for breast cancer 05/05/2013  . Special screening for malignant neoplasms, colon 05/05/2013  . Current smoker 01/10/2012  . Narcotic drug use 01/10/2012  . Headache, migraine 10/08/2011    Past Surgical History:  Procedure Laterality Date  . CHOLECYSTECTOMY    . PARTIAL HYSTERECTOMY    . VAGINAL DELIVERY     1    Prior to Admission medications   Medication Sig Start Date End Date Taking? Authorizing Provider  AMITIZA 24 MCG capsule TAKE 1 CAPSULE (24 MCG TOTAL) BY MOUTH DAILY WITH BREAKFAST. 11/04/14   Shelia Media, MD  calcipotriene (DOVONOX) 0.005 %  cream Apply topically daily. To psoriasis 7 days per week as needed 06/05/20   Deirdre Evener, MD  calcipotriene-betamethasone [facility]) ointment APPLY TO AFFECTED AREAS EVERY DAY 06/02/20   Deirdre Evener, MD  citalopram (CELEXA) 20 MG tablet Take 30 mg by mouth daily.  04/12/13   [provider]  clonazePAM Scarlette Calico) 1 MG tablet  06/26/12   [provider]  cyclobenzaprine (FLEXERIL) 10 MG tablet Take 10 mg by mouth 3 (three) times daily as needed for muscle spasms.    [provider]  dihydroergotamine (MIGRANAL) 4 MG/ML nasal spray Place 1 spray into the nose as needed for migraine. Use in one nostril as directed.  No more than 4 sprays in one hour    [provider]  lactulose (CHRONULAC) 10 GM/15ML solution Take 10 g by mouth as needed for mild constipation.    [provider]  mometasone (ELOCON) 0.1 % cream Apply 1 application topically daily. Up to 5 days per week to psoriasis until clear 06/05/20   Deirdre Evener, MD  morphine (MS CONTIN) 30 MG 12 hr tablet Take 30 mg by mouth 2 (two) times daily. 1 tab bid 06/15/12   [provider]  Oxycodone HCl 10 MG TABS Take 10 mg by mouth 3 (three) times daily. i 1 tab tifd 06/15/12   [provider]  traZODone (DESYREL) 50 MG tablet Take 50-150 mg by mouth at bedtime as needed for sleep.  [provider]    Allergies Vicodin [hydrocodone-acetaminophen]  Family History  Problem Relation Age of Onset  . Heart disease Mother   . Heart disease Father   . Breast cancer Sister   . Heart disease Brother     Social History Social History   Tobacco Use  . Smoking status: Current Every Day Smoker    Types: Cigarettes  . Smokeless tobacco: Never Used  Substance Use Topics  . Alcohol use: No  . Drug use: No    Review of Systems  Constitutional: No fever/chills Eyes: No visual changes. ENT: No sore throat. Cardiovascular: Denies chest pain. Respiratory: Denies  shortness of breath. Gastrointestinal: Positive for abdominal pain, nausea and vomiting.  No diarrhea.  No constipation. Genitourinary: Negative for dysuria. Musculoskeletal: Negative for back pain. Skin: Negative for rash. Neurological: Negative for headaches, focal weakness or numbness.   ____________________________________________   PHYSICAL EXAM:  VITAL SIGNS: ED Triage Vitals  Enc Vitals Group     BP 07/22/20 0603 113/88     Pulse Rate 07/22/20 0603 77     Resp 07/22/20 0603 18     Temp 07/22/20 0603 (!) 97.5 F (36.4 C)     Temp Source 07/22/20 0603 Oral     SpO2 07/22/20 0603 100 %     Weight 07/22/20 0602 150 lb (68 kg)     Height 07/22/20 0602 5\' 5"  (1.651 m)     Head Circumference --      Peak Flow --      Pain Score 07/22/20 0602 4     Pain Loc --      Pain Edu? --      Excl. in GC? --     Constitutional: Alert and oriented. Well appearing and in mild acute distress. Eyes: Conjunctivae are normal. PERRL. EOMI. Head: Atraumatic. Nose: No congestion/rhinnorhea. Mouth/Throat: Mucous membranes are mildly dry. Neck: No stridor.   Cardiovascular: Normal rate, regular rhythm. Grossly normal heart sounds.  Good peripheral circulation. Respiratory: Normal respiratory effort.  No retractions. Lungs CTAB. Gastrointestinal: Soft with mild diffuse tenderness to palpation without rebound or guarding. No distention. No abdominal bruits. No CVA tenderness. Musculoskeletal: No lower extremity tenderness nor edema.  No joint effusions. Neurologic:  Normal speech and language. No gross focal neurologic deficits are appreciated.  Skin:  Skin is warm, dry and intact. No rash noted. Psychiatric: Mood and affect are normal. Speech and behavior are normal.  ____________________________________________   LABS (all labs ordered are listed, but only abnormal results are displayed)  Labs Reviewed  COMPREHENSIVE METABOLIC PANEL - Abnormal; Notable for the following components:       Result Value   Glucose, Bld 123 (*)    Calcium 8.8 (*)    Total Protein 6.4 (*)    AST 254 (*)    ALT 149 (*)    Alkaline Phosphatase 156 (*)    Total Bilirubin 1.5 (*)    All other components within normal limits  CBC - Abnormal; Notable for the following components:   Platelets 135 (*)    All other components within normal limits  LIPASE, BLOOD  URINALYSIS, COMPLETE (UACMP) WITH MICROSCOPIC  TROPONIN I (HIGH SENSITIVITY)   ____________________________________________  EKG  ED ECG REPORT I, Sariyah Corcino J, the attending physician, personally viewed and interpreted this ECG.   Date: 07/22/2020  EKG Time: 0647  Rate: 64  Rhythm: normal EKG, normal sinus rhythm  Axis: Normal  Intervals:none  ST&T Change: Nonspecific  ____________________________________________  RADIOLOGY I,  Shauni Henner J, personally viewed and evaluated these images (plain radiographs) as part of my medical decision making, as well as reviewing the written report by the radiologist.  ED MD interpretation: Pending  Official radiology report(s): No results found.  ____________________________________________   PROCEDURES  Procedure(s) performed (including Critical Care):  .1-3 Lead EKG Interpretation Performed by: Irean Hong, MD Authorized by: Irean Hong, MD     Interpretation: normal     ECG rate:  77   ECG rate assessment: normal     Rhythm: sinus rhythm     Ectopy: none     Conduction: normal   Comments:     Patient placed on cardiac monitor to evaluate for arrhythmias     ____________________________________________   INITIAL IMPRESSION / ASSESSMENT AND PLAN / ED COURSE  As part of my medical decision making, I reviewed the following data within the electronic MEDICAL RECORD NUMBER Nursing notes reviewed and incorporated, Labs reviewed, EKG interpreted, Old chart reviewed, Patient signed out to Dr. Scotty Court and Notes from prior ED visits     55 year old female who has been  constipated x3 weeks presenting with abdominal pain, nausea and vomiting.  No flatus. Differential diagnosis includes, but is not limited to, ovarian cyst, ovarian torsion, acute appendicitis, diverticulitis, urinary tract infection/pyelonephritis, endometriosis, bowel obstruction, colitis, renal colic, gastroenteritis, hernia, fibroids, endometriosis, pregnancy related pain including ectopic pregnancy, etc.  We will obtain lab work, CT abdomen/pelvis.  Initiate IV fluid resuscitation, IV fentanyl for pain, IV Zofran for nausea/vomiting.  Clinical Course as of 07/22/20 0710  Tue Jul 22, 2020  0709 Care transferred to Dr. Scotty Court at change of shift pending CT scan.  If unremarkable, would obtain right upper quadrant abdominal ultrasound given elevated LFTs. [JS]    Clinical Course User Index [JS] Irean Hong, MD     ____________________________________________   FINAL CLINICAL IMPRESSION(S) / ED DIAGNOSES  Final diagnoses:  Generalized abdominal pain  Nausea and vomiting, intractability of vomiting not specified, unspecified vomiting type     ED Discharge Orders    None      *Please note:  Brenda Conrad was evaluated in Emergency Department on 07/22/2020 for the symptoms described in the history of present illness. She was evaluated in the context of the global COVID-19 pandemic, which necessitated consideration that the patient might be at risk for infection with the SARS-CoV-2 virus that causes COVID-19. Institutional protocols and algorithms that pertain to the evaluation of patients at risk for COVID-19 are in a state of rapid change based on information released by regulatory bodies including the CDC and federal and state organizations. These policies and algorithms were followed during the patient's care in the ED.  Some ED evaluations and interventions may be delayed as a result of limited staffing during and the pandemic.*   Note:  This document was prepared using Dragon  voice recognition software and may include unintentional dictation errors.   Irean Hong, MD 07/22/20 [phone]

## 2020-07-22 NOTE — ED Notes (Signed)
Husband at nurses station stating that she feels better and isn't nauseous anymore and she is ready to go home. EDP made aware.

## 2020-07-22 NOTE — ED Provider Notes (Signed)
Procedures  Clinical Course as of 07/22/20 1024  Tue Jul 22, 2020  0709 Care transferred to Dr. Scotty Court at change of shift pending CT scan.  If unremarkable, would obtain right upper quadrant abdominal ultrasound given elevated LFTs. [JS]    Clinical Course User Index [JS] Irean Hong, MD    ----------------------------------------- 10:24 AM on 07/22/2020 -----------------------------------------  Patient reassessed.  CT scan is unremarkable.  Repeat abdominal exam is completely benign, no tenderness.  Patient confirms that she is already had a cholecystectomy.  I think her elevated LFTs are due to viral syndrome.  She is feeling better, symptoms are controlled and she is tolerating oral intake, she is stable to go home.   Sharman Cheek, MD 07/22/20 1025

## 2020-07-22 NOTE — ED Triage Notes (Signed)
Pt to ed via ems from home. Pt states since yesterday she has had n/v and abd pain. Pt states a stomach bug has been going through her house. Pt states pain is intermittent, pt has been unable to eat at home.

## 2020-09-25 ENCOUNTER — Emergency Department
Admission: EM | Admit: 2020-09-25 | Discharge: 2020-09-25 | Disposition: A | Discharge status: Home / Self Care | Payer: Medicare HMO | Attending: Emergency Medicine | Admitting: Emergency Medicine

## 2020-09-25 ENCOUNTER — Other Ambulatory Visit: Payer: Self-pay

## 2020-09-25 ENCOUNTER — Emergency Department: Payer: Medicare HMO

## 2020-09-25 DIAGNOSIS — S161XXA Strain of muscle, fascia and tendon at neck level, initial encounter: Secondary | ICD-10-CM | POA: Insufficient documentation

## 2020-09-25 DIAGNOSIS — F17210 Nicotine dependence, cigarettes, uncomplicated: Secondary | ICD-10-CM | POA: Diagnosis not present

## 2020-09-25 DIAGNOSIS — Y92410 Unspecified street and highway as the place of occurrence of the external cause: Secondary | ICD-10-CM | POA: Diagnosis not present

## 2020-09-25 DIAGNOSIS — S199XXA Unspecified injury of neck, initial encounter: Secondary | ICD-10-CM | POA: Diagnosis present

## 2020-09-25 MED ORDER — CYCLOBENZAPRINE HCL 10 MG PO TABS
10.0000 mg | ORAL_TABLET | Freq: Once | ORAL | Status: AC
  Administered 2020-09-25: 10 mg via ORAL
  Filled 2020-09-25: qty 1

## 2020-09-25 MED ORDER — IBUPROFEN 600 MG PO TABS: 600.0000 mg | ORAL_TABLET | Freq: Four times a day (QID) | ORAL | 0 refills | Status: AC | PRN

## 2020-09-25 MED ORDER — IBUPROFEN 600 MG PO TABS
600.0000 mg | ORAL_TABLET | Freq: Once | ORAL | Status: AC
  Administered 2020-09-25: 600 mg via ORAL
  Filled 2020-09-25: qty 1

## 2020-09-25 MED ORDER — CYCLOBENZAPRINE HCL 10 MG PO TABS: 10.0000 mg | ORAL_TABLET | Freq: Three times a day (TID) | ORAL | 0 refills | Status: AC | PRN
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
## 2020-09-25 NOTE — Discharge Instructions (Addendum)
No acute findings x-ray of the cervical spine.  Read and follow discharge care instruction.  Take medication as directed.
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
## 2021-01-05 ENCOUNTER — Other Ambulatory Visit: Payer: Self-pay | Admitting: Internal Medicine

## 2021-01-05 DIAGNOSIS — Z1231 Encounter for screening mammogram for malignant neoplasm of breast: Secondary | ICD-10-CM

## 2021-01-20 ENCOUNTER — Ambulatory Visit
Admission: RE | Admit: 2021-01-20 | Discharge: 2021-01-20 | Disposition: A | Discharge status: Home / Self Care | Payer: Medicare HMO | Source: Ambulatory Visit | Attending: Internal Medicine | Admitting: Internal Medicine

## 2021-01-20 ENCOUNTER — Other Ambulatory Visit: Payer: Self-pay

## 2021-01-20 DIAGNOSIS — Z1231 Encounter for screening mammogram for malignant neoplasm of breast: Secondary | ICD-10-CM | POA: Insufficient documentation

## 2021-10-27 IMAGING — CT CT ABD-PELV W/O CM
2 of 4 series · 16 of 46 positions shown, 18 images · non-contrast
Comparison: 03/02/2006

CLINICAL DATA: Diffuse abdominal pain, nausea and vomiting since
yesterday. Decreased bowel movements related to pain medication for
the past 3 weeks.

EXAM:
CT ABDOMEN AND PELVIS WITHOUT CONTRAST
TECHNIQUE: Multidetector CT imaging of the abdomen and pelvis was performed
following the standard protocol without IV contrast.

[Series 2: axial st · axial · 0.66mm/px · z∈[-1126,-711]mm · 13 of 91 slices shown, 15 images]
[im 4/91  soft-tissue]
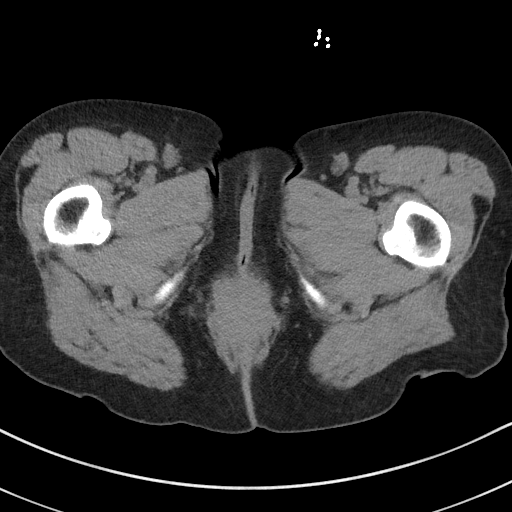
[im 4/91  bone]
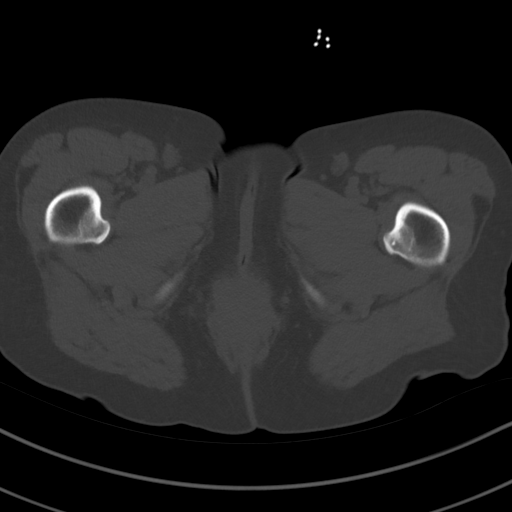
[im 12/91  soft-tissue]
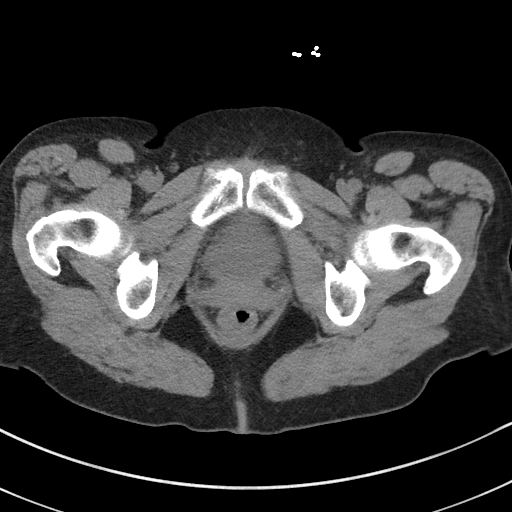
[im 20/91  soft-tissue]
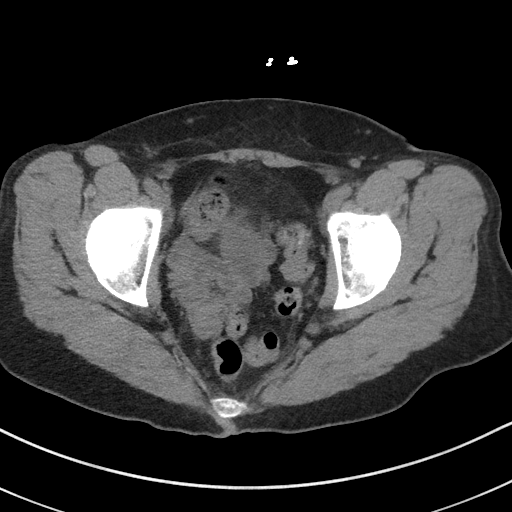
[im 24/91  soft-tissue]
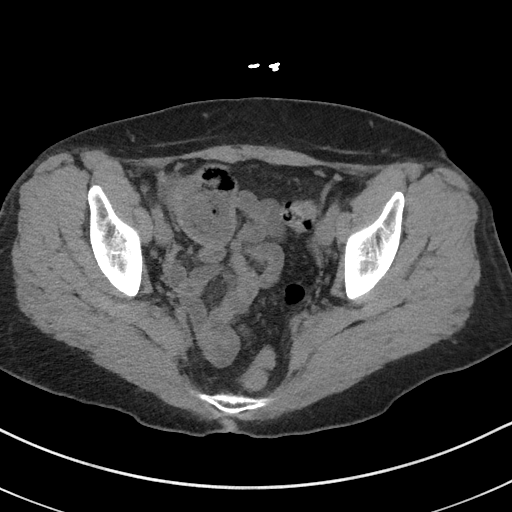
[im 32/91  soft-tissue]
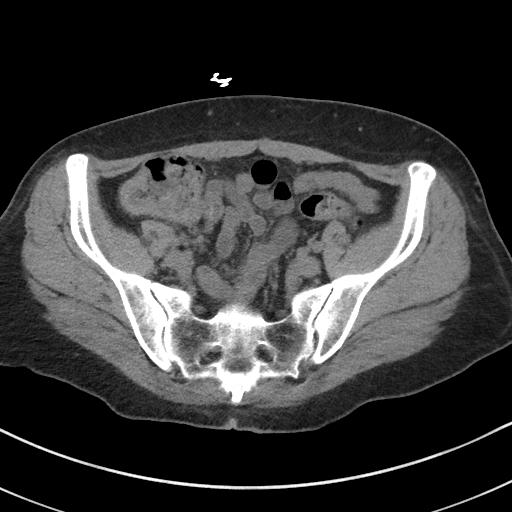
[im 40/91  soft-tissue]
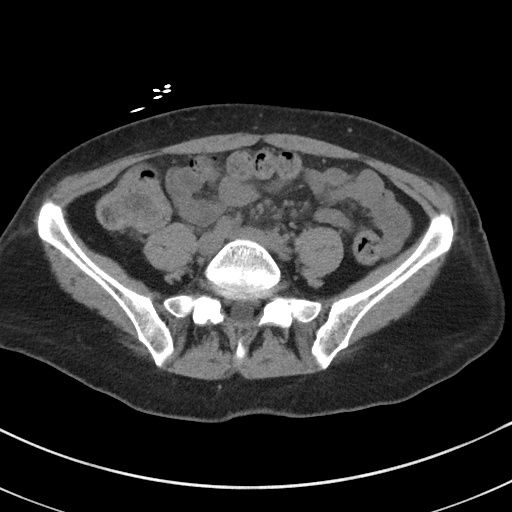
[im 47/91  soft-tissue]
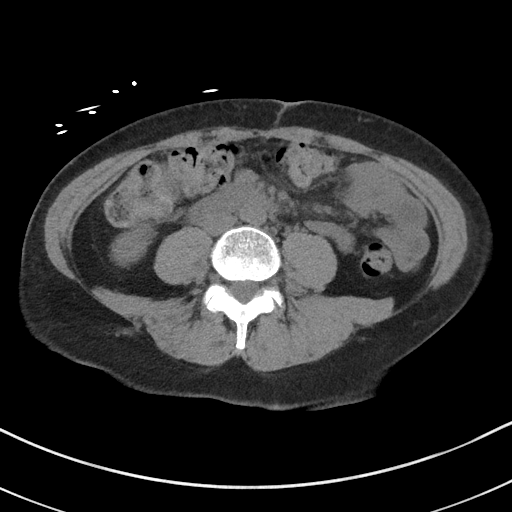
[im 51/91  soft-tissue]
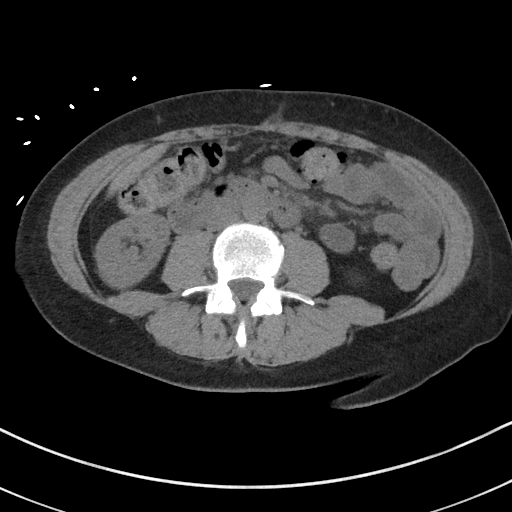
[im 59/91  soft-tissue]
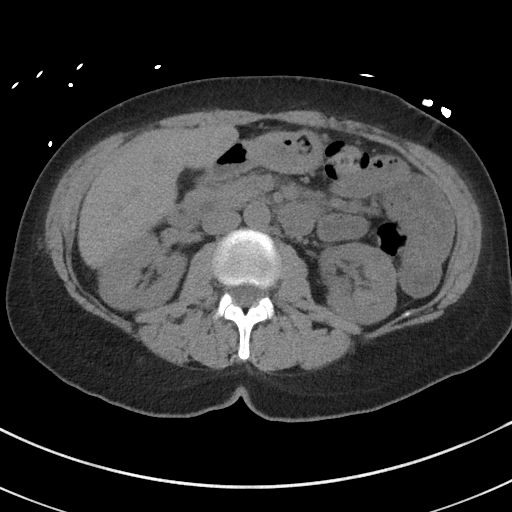
[im 59/91  bone]
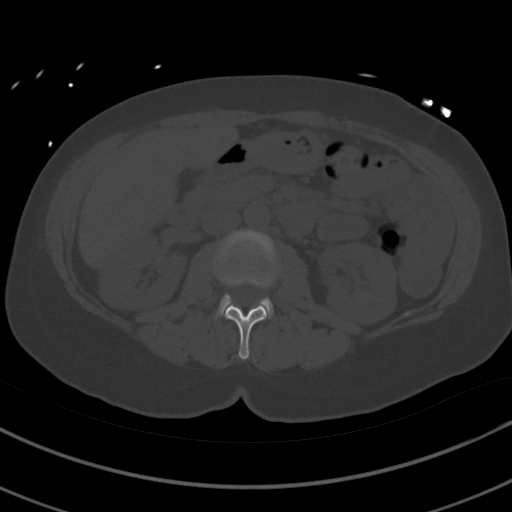
[im 67/91  soft-tissue]
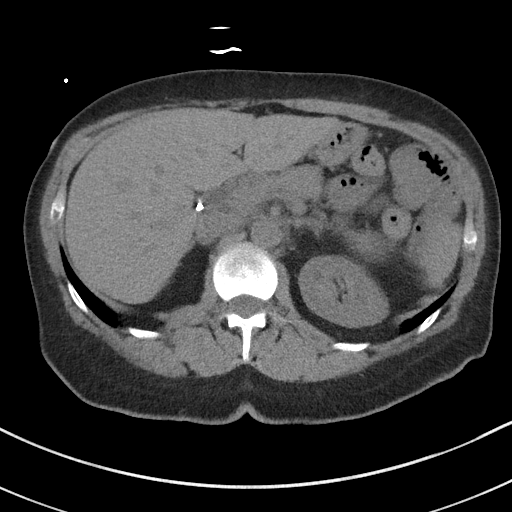
[im 71/91  soft-tissue]
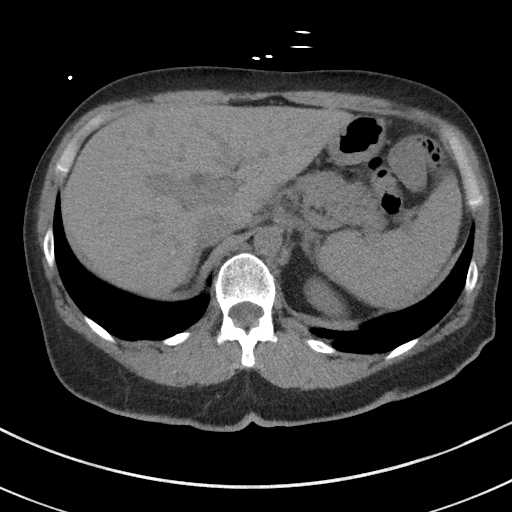
[im 79/91  soft-tissue]
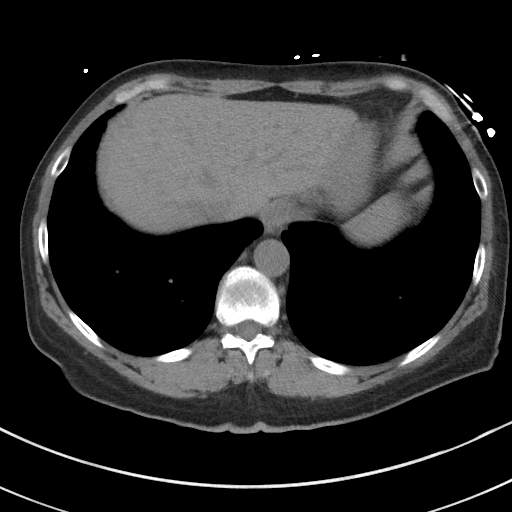
[im 87/91  soft-tissue]
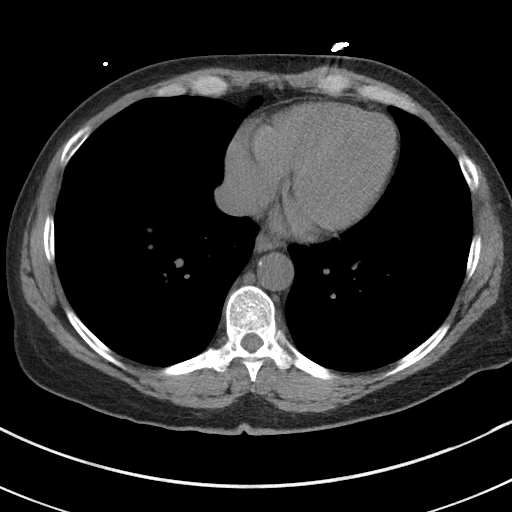

[Series 5: coronal st · coronal · 0.74mm/px · 3 of 83 slices shown]
[im 28/83  soft-tissue]
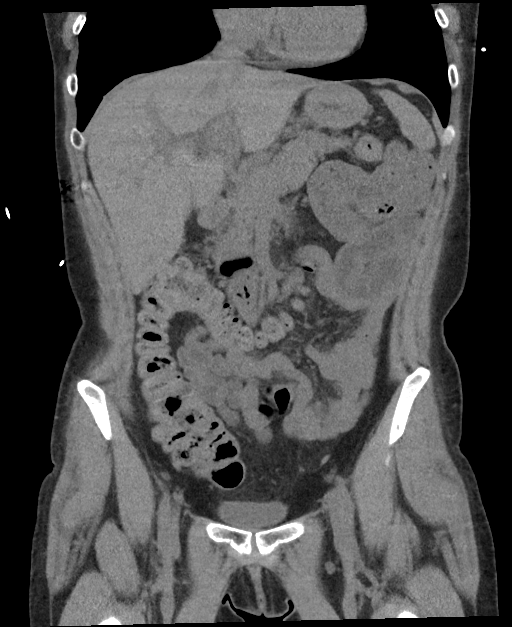
[im 37/83  soft-tissue]
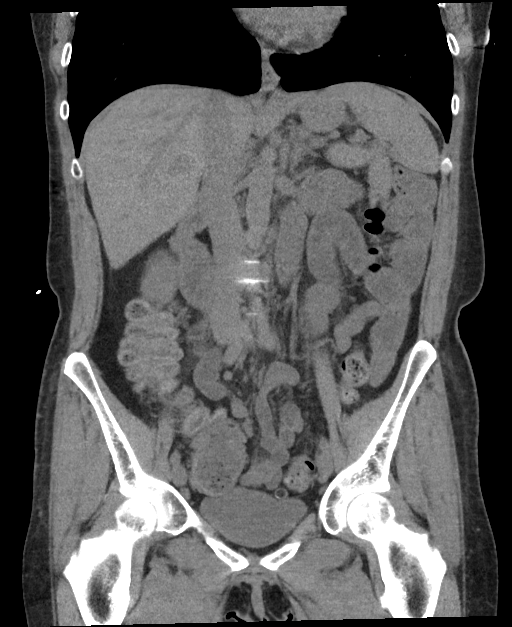
[im 46/83  soft-tissue]
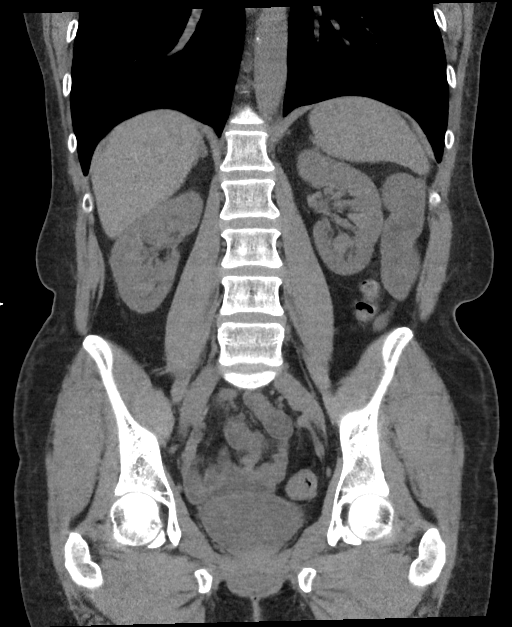

[16 of 46 positions shown; findings below may reference images not displayed]

FINDINGS: Lower chest: Unremarkable

Hepatobiliary: No focal liver abnormality is seen. Status post
cholecystectomy. No biliary dilatation.

Pancreas: Unremarkable. No pancreatic ductal dilatation or
surrounding inflammatory changes.

Spleen: Normal in size without focal abnormality.

Adrenals/Urinary Tract: Adrenal glands are unremarkable. Kidneys are
normal, without renal calculi, focal lesion, or hydronephrosis.
Bladder is unremarkable.

Stomach/Bowel: Unremarkable stomach, small bowel and colon. Normal
amount of stool. No evidence of appendicitis.

Vascular/Lymphatic: Bilateral pelvic phleboliths.

Reproductive: Status post hysterectomy. No adnexal masses.

Other: No abdominal wall hernia or abnormality. No abdominopelvic
ascites.

Musculoskeletal: No acute or significant osseous findings.
IMPRESSION: No acute abnormality.  Normal amount of stool.

## 2021-12-06 ENCOUNTER — Ambulatory Visit
Admission: EM | Admit: 2021-12-06 | Discharge: 2021-12-06 | Disposition: A | Discharge status: Home / Self Care | Payer: Medicare HMO | Attending: Emergency Medicine | Admitting: Emergency Medicine

## 2021-12-06 DIAGNOSIS — J029 Acute pharyngitis, unspecified: Secondary | ICD-10-CM | POA: Diagnosis not present

## 2021-12-06 LAB — POCT RAPID STREP A (OFFICE): Rapid Strep A Screen: NEGATIVE

## 2021-12-06 NOTE — ED Provider Notes (Signed)
Brenda Conrad    CSN: [id number] Arrival date & time: 12/06/21  5916      History   Chief Complaint Chief Complaint  Patient presents with   Sore    HPI Brenda Conrad is a 56 y.o. female.  Patient presents with 1 day history of sore throat.  No fever, chills, rash, cough, shortness of breath, vomiting, diarrhea, or other symptoms.  No treatments at home.  Her granddaughter has strep throat.  The history is provided by the patient and medical records.    Past Medical History:  Diagnosis Date   Cataract    Depression    Elevated bilirubin    Fatty liver    Fibromyalgia 2007   reports she is on disability due to fibromyalgia   HLD (hyperlipidemia)    Vitamin D deficiency     Patient Active Problem List   Diagnosis Date Noted   Routine general medical examination at a health care facility 10/24/2013   Unspecified constipation 10/24/2013   Depression, major 05/05/2013   Fibromyalgia 05/05/2013   Elevated bilirubin 05/05/2013   Screening for breast cancer 05/05/2013   Special screening for malignant neoplasms, colon 05/05/2013   Current smoker 01/10/2012   Narcotic drug use 01/10/2012   Headache, migraine 10/08/2011    Past Surgical History:  Procedure Laterality Date   ABDOMINAL HYSTERECTOMY     CHOLECYSTECTOMY     PARTIAL HYSTERECTOMY     VAGINAL DELIVERY     1    OB History   No obstetric history on file.      Home Medications    Prior to Admission medications   Medication Sig Start Date End Date Taking? Authorizing Provider  AMITIZA 24 MCG capsule TAKE 1 CAPSULE (24 MCG TOTAL) BY MOUTH DAILY WITH BREAKFAST. 11/04/14   Shelia Media, MD  buPROPion (WELLBUTRIN XL) 150 MG 24 hr tablet Take 450 mg by mouth daily. 11/17/21   [provider]  calcipotriene (DOVONOX) 0.005 % cream Apply topically daily. To psoriasis 7 days per week as needed 06/05/20   Deirdre Evener, MD  calcipotriene-betamethasone [facility]) ointment APPLY TO  AFFECTED AREAS EVERY DAY 06/02/20   Deirdre Evener, MD  citalopram (CELEXA) 20 MG tablet Take 30 mg by mouth daily.  04/12/13   [provider]  clonazePAM Scarlette Calico) 1 MG tablet  06/26/12   [provider]  cyclobenzaprine (FLEXERIL) 10 MG tablet Take 10 mg by mouth 3 (three) times daily as needed for muscle spasms.    [provider]  cyclobenzaprine (FLEXERIL) 10 MG tablet Take 1 tablet (10 mg total) by mouth 3 (three) times daily as needed. 09/25/20   Joni Reining, PA-C  dicyclomine (BENTYL) 10 MG capsule Take 1 capsule (10 mg total) by mouth every 6 (six) hours as needed for up to 7 days for spasms (abdominal pain). 07/22/20 07/29/20  Sharman Cheek, MD  dihydroergotamine (MIGRANAL) 4 MG/ML nasal spray Place 1 spray into the nose as needed for migraine. Use in one nostril as directed.  No more than 4 sprays in one hour    [provider]  ibuprofen (ADVIL) 600 MG tablet Take 1 tablet (600 mg total) by mouth every 6 (six) hours as needed for moderate pain. 09/25/20   Joni Reining, PA-C  lactulose [facility]) 10 GM/15ML solution Take 10 g by mouth as needed for mild constipation.    [provider]  mometasone (ELOCON) 0.1 % cream Apply 1 application topically daily. Up  to 5 days per week to psoriasis until clear 06/05/20   Deirdre Evener, MD  morphine (MS CONTIN) 30 MG 12 hr tablet Take 30 mg by mouth 2 (two) times daily. 1 tab bid 06/15/12   [provider]  ondansetron (ZOFRAN ODT) 4 MG disintegrating tablet Take 1 tablet (4 mg total) by mouth every 8 (eight) hours as needed for nausea or vomiting. 07/22/20   Sharman Cheek, MD  Oxycodone HCl 10 MG TABS Take 10 mg by mouth 3 (three) times daily. i 1 tab tifd 06/15/12   [provider]  tiZANidine (ZANAFLEX) 4 MG tablet Take 4-8 mg by mouth 2 (two) times daily as needed. 10/24/21   [provider]  traZODone (DESYREL) 50 MG tablet Take 50-150 mg by mouth at bedtime as  needed for sleep.    [provider]    Family History Family History  Problem Relation Age of Onset   Heart disease Mother    Heart disease Father    Breast cancer Sister    Heart disease Brother     Social History Social History   Tobacco Use   Smoking status: Every Day    Types: Cigarettes   Smokeless tobacco: Never  Substance Use Topics   Alcohol use: No   Drug use: No     Allergies   Fentanyl and Vicodin [hydrocodone-acetaminophen]   Review of Systems Review of Systems  Constitutional:  Negative for chills and fever.  HENT:  Positive for sore throat. Negative for ear pain, trouble swallowing and voice change.   Respiratory:  Negative for cough and shortness of breath.   Gastrointestinal:  Negative for diarrhea and vomiting.  Skin:  Negative for color change and rash.  All other systems reviewed and are negative.    Physical Exam Triage Vital Signs ED Triage Vitals [12/06/21 0940]  Enc Vitals Group     BP      Pulse      Resp      Temp      Temp src      SpO2      Weight 185 lb (83.9 kg)     Height 5\' 6"  (1.676 m)     Head Circumference      Peak Flow      Pain Score 3     Pain Loc      Pain Edu?      Excl. in GC?    No data found.  Updated Vital Signs BP 122/82   Pulse 71   Temp 97.9 F (36.6 C)   Resp 18   Ht 5\' 6"  (1.676 m)   Wt 185 lb (83.9 kg)   SpO2 98%   BMI 29.86 kg/m   Visual Acuity Right Eye Distance:   Left Eye Distance:   Bilateral Distance:    Right Eye Near:   Left Eye Near:    Bilateral Near:     Physical Exam Vitals and nursing note reviewed.  Constitutional:      General: She is not in acute distress.    Appearance: Normal appearance. She is well-developed. She is not ill-appearing.  HENT:     Right Ear: Tympanic membrane normal.     Left Ear: Tympanic membrane normal.     Nose: Nose normal.     Mouth/Throat:     Mouth: Mucous membranes are moist.     Pharynx: Oropharynx is clear.   Cardiovascular:     Rate and Rhythm: Normal rate  and regular rhythm.     Heart sounds: Normal heart sounds.  Pulmonary:     Effort: Pulmonary effort is normal. No respiratory distress.     Breath sounds: Normal breath sounds.  Musculoskeletal:     Cervical back: Neck supple.  Skin:    General: Skin is warm and dry.  Neurological:     Mental Status: She is alert.  Psychiatric:        Mood and Affect: Mood normal.        Behavior: Behavior normal.      UC Treatments / Results  Labs (all labs ordered are listed, but only abnormal results are displayed) Labs Reviewed  POCT RAPID STREP A (OFFICE)    EKG   Radiology No results found.  Procedures Procedures (including critical care time)  Medications Ordered in UC Medications - No data to display  Initial Impression / Assessment and Plan / UC Course  I have reviewed the triage vital signs and the nursing notes.  Pertinent labs & imaging results that were available during my care of the patient were reviewed by me and considered in my medical decision making (see chart for details).    Sore throat.  Rapid strep negative.  Patient declines throat culture.  Discussed symptomatic treatment including Tylenol or ibuprofen as needed.  Instructed patient to follow-up with her PCP if her symptoms are not improving.  Education provided on sore throat.  Patient agrees to plan of care.  Final Clinical Impressions(s) / UC Diagnoses   Final diagnoses:  Sore throat     Discharge Instructions      The strep test is negative.  Take Tylenol or ibuprofen as needed.  Follow up with your primary care provider if your symptoms are not improving.        ED Prescriptions   None    PDMP not reviewed this encounter.   Mickie Bail, NP 12/06/21 1018

## 2021-12-06 NOTE — Discharge Instructions (Addendum)
The strep test is negative.  Take Tylenol or ibuprofen as needed.  Follow up with your primary care provider if your symptoms are not improving.

## 2021-12-06 NOTE — ED Triage Notes (Signed)
Patient to Urgent Care with complaints of a sore throat. Reports that her symptoms started yesterday afternoon.  Denies any known fevers or other symptoms. Reports that her granddaughter is sick and tested positive for strep throat.

## 2023-01-24 ENCOUNTER — Other Ambulatory Visit: Payer: Self-pay | Admitting: Internal Medicine

## 2023-01-24 DIAGNOSIS — Z1231 Encounter for screening mammogram for malignant neoplasm of breast: Secondary | ICD-10-CM

## 2024-01-16 ENCOUNTER — Other Ambulatory Visit: Payer: Self-pay | Admitting: Internal Medicine

## 2024-01-16 ENCOUNTER — Encounter

## 2024-01-16 DIAGNOSIS — Z1231 Encounter for screening mammogram for malignant neoplasm of breast: Secondary | ICD-10-CM

## 2024-02-09 ENCOUNTER — Encounter

## 2024-02-19 LAB — COLOGUARD: COLOGUARD: NEGATIVE

## 2024-03-10 ENCOUNTER — Encounter: Payer: Self-pay | Admitting: Internal Medicine

## 2024-03-12 ENCOUNTER — Encounter

## 2024-03-16 ENCOUNTER — Telehealth: Payer: Self-pay

## 2024-03-16 NOTE — Telephone Encounter (Signed)
 Attempted to reach patient concerning her colonoscopy recall; unable to speak with patient; left message and number to the office for her to call back and schedule colonoscopy;

## 2024-04-04 NOTE — Telephone Encounter (Signed)
 Attempted to reach patient concerning colonoscopy recall; unable to speak with patient;  left message and number to the office for patient to call back and schedule appts;

## 2024-04-24 ENCOUNTER — Encounter

## 2024-05-18 ENCOUNTER — Encounter
